# Patient Record
Sex: Male | Born: 1960 | Race: White | Hispanic: No | Smoking: Former smoker
Health system: Southern US, Community
[De-identification: ages and names within clinical notes are randomized; demographics above are authoritative.]

## PROBLEM LIST (undated history)

## (undated) DIAGNOSIS — T7840XA Allergy, unspecified, initial encounter: Secondary | ICD-10-CM

## (undated) DIAGNOSIS — E119 Type 2 diabetes mellitus without complications: Secondary | ICD-10-CM

## (undated) DIAGNOSIS — I10 Essential (primary) hypertension: Secondary | ICD-10-CM

## (undated) DIAGNOSIS — N200 Calculus of kidney: Secondary | ICD-10-CM

## (undated) DIAGNOSIS — M1990 Unspecified osteoarthritis, unspecified site: Secondary | ICD-10-CM

## (undated) DIAGNOSIS — E785 Hyperlipidemia, unspecified: Secondary | ICD-10-CM

## (undated) HISTORY — DX: Unspecified osteoarthritis, unspecified site: M19.90

## (undated) HISTORY — PX: KNEE SURGERY: SHX244

## (undated) HISTORY — PX: SHOULDER SURGERY: SHX246

## (undated) HISTORY — PX: TOTAL HIP ARTHROPLASTY: SHX124

## (undated) HISTORY — DX: Essential (primary) hypertension: I10

## (undated) HISTORY — DX: Hyperlipidemia, unspecified: E78.5

## (undated) HISTORY — DX: Calculus of kidney: N20.0

## (undated) HISTORY — DX: Allergy, unspecified, initial encounter: T78.40XA

---

## 2012-01-24 ENCOUNTER — Encounter (HOSPITAL_COMMUNITY): Payer: Self-pay | Admitting: *Deleted

## 2012-01-24 ENCOUNTER — Emergency Department (HOSPITAL_COMMUNITY)
Admission: EM | Admit: 2012-01-24 | Discharge: 2012-01-24 | Disposition: A | Discharge status: Home / Self Care | Payer: No Typology Code available for payment source | Attending: Emergency Medicine | Admitting: Emergency Medicine

## 2012-01-24 DIAGNOSIS — IMO0002 Reserved for concepts with insufficient information to code with codable children: Secondary | ICD-10-CM

## 2012-01-24 DIAGNOSIS — Z79899 Other long term (current) drug therapy: Secondary | ICD-10-CM | POA: Insufficient documentation

## 2012-01-24 DIAGNOSIS — Z7982 Long term (current) use of aspirin: Secondary | ICD-10-CM | POA: Insufficient documentation

## 2012-01-24 DIAGNOSIS — W3309XA Accidental discharge of other larger firearm, initial encounter: Secondary | ICD-10-CM | POA: Insufficient documentation

## 2012-01-24 DIAGNOSIS — Y929 Unspecified place or not applicable: Secondary | ICD-10-CM | POA: Insufficient documentation

## 2012-01-24 DIAGNOSIS — E119 Type 2 diabetes mellitus without complications: Secondary | ICD-10-CM | POA: Insufficient documentation

## 2012-01-24 DIAGNOSIS — Z23 Encounter for immunization: Secondary | ICD-10-CM | POA: Insufficient documentation

## 2012-01-24 DIAGNOSIS — S41109A Unspecified open wound of unspecified upper arm, initial encounter: Secondary | ICD-10-CM | POA: Insufficient documentation

## 2012-01-24 DIAGNOSIS — Y9389 Activity, other specified: Secondary | ICD-10-CM | POA: Insufficient documentation

## 2012-01-24 HISTORY — DX: Type 2 diabetes mellitus without complications: E11.9

## 2012-01-24 MED ORDER — TETANUS-DIPHTH-ACELL PERTUSSIS 5-2.5-18.5 LF-MCG/0.5 IM SUSP
INTRAMUSCULAR | Status: AC
  Filled 2012-01-24: qty 0.5

## 2012-01-24 MED ORDER — TETANUS-DIPHTH-ACELL PERTUSSIS 5-2.5-18.5 LF-MCG/0.5 IM SUSP
0.5000 mL | Freq: Once | INTRAMUSCULAR | Status: AC
  Administered 2012-01-24: 0.5 mL via INTRAMUSCULAR
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
## 2012-01-24 NOTE — ED Notes (Signed)
[employer] at bedside Officer Dorothey Baseman

## 2012-06-03 ENCOUNTER — Ambulatory Visit (INDEPENDENT_AMBULATORY_CARE_PROVIDER_SITE_OTHER): Payer: Federal, State, Local not specified - PPO | Admitting: Internal Medicine

## 2012-06-03 ENCOUNTER — Other Ambulatory Visit (INDEPENDENT_AMBULATORY_CARE_PROVIDER_SITE_OTHER): Payer: Federal, State, Local not specified - PPO

## 2012-06-03 ENCOUNTER — Encounter: Payer: Self-pay | Admitting: Internal Medicine

## 2012-06-03 VITALS — BP 136/72 | HR 88 | Temp 98.6°F | Resp 16 | Ht 73.0 in | Wt 293.0 lb

## 2012-06-03 DIAGNOSIS — E785 Hyperlipidemia, unspecified: Secondary | ICD-10-CM | POA: Insufficient documentation

## 2012-06-03 DIAGNOSIS — Z0000 Encounter for general adult medical examination without abnormal findings: Secondary | ICD-10-CM | POA: Insufficient documentation

## 2012-06-03 DIAGNOSIS — I10 Essential (primary) hypertension: Secondary | ICD-10-CM

## 2012-06-03 DIAGNOSIS — R9431 Abnormal electrocardiogram [ECG] [EKG]: Secondary | ICD-10-CM | POA: Insufficient documentation

## 2012-06-03 DIAGNOSIS — L84 Corns and callosities: Secondary | ICD-10-CM | POA: Insufficient documentation

## 2012-06-03 DIAGNOSIS — E1149 Type 2 diabetes mellitus with other diabetic neurological complication: Secondary | ICD-10-CM

## 2012-06-03 DIAGNOSIS — Z23 Encounter for immunization: Secondary | ICD-10-CM

## 2012-06-03 DIAGNOSIS — Z136 Encounter for screening for cardiovascular disorders: Secondary | ICD-10-CM

## 2012-06-03 DIAGNOSIS — E118 Type 2 diabetes mellitus with unspecified complications: Secondary | ICD-10-CM | POA: Insufficient documentation

## 2012-06-03 LAB — CBC WITH DIFFERENTIAL/PLATELET
Basophils Absolute: 0 10*3/uL (ref 0.0–0.1)
HCT: 40.6 % (ref 39.0–52.0)
Lymphs Abs: 1 10*3/uL (ref 0.7–4.0)
MCV: 89.5 fl (ref 78.0–100.0)
Monocytes Absolute: 0.5 10*3/uL (ref 0.1–1.0)
Monocytes Relative: 9.8 % (ref 3.0–12.0)
Neutrophils Relative %: 68.8 % (ref 43.0–77.0)
Platelets: 172 10*3/uL (ref 150.0–400.0)
RDW: 13 % (ref 11.5–14.6)

## 2012-06-03 LAB — MICROALBUMIN / CREATININE URINE RATIO
Creatinine,U: 64.3 mg/dL
Microalb, Ur: 0.4 mg/dL (ref 0.0–1.9)

## 2012-06-03 LAB — HM DIABETES FOOT EXAM: HM Diabetic Foot Exam: ABNORMAL

## 2012-06-03 LAB — URINALYSIS, ROUTINE W REFLEX MICROSCOPIC
Nitrite: NEGATIVE
Specific Gravity, Urine: 1.02 (ref 1.000–1.030)
Total Protein, Urine: NEGATIVE
pH: 5.5 (ref 5.0–8.0)

## 2012-06-03 LAB — COMPREHENSIVE METABOLIC PANEL
ALT: 31 U/L (ref 0–53)
Alkaline Phosphatase: 58 U/L (ref 39–117)
Sodium: 133 mEq/L — ABNORMAL LOW (ref 135–145)
Total Bilirubin: 0.5 mg/dL (ref 0.3–1.2)
Total Protein: 6.3 g/dL (ref 6.0–8.3)

## 2012-06-03 MED ORDER — SITAGLIP PHOS-METFORMIN HCL ER 50-1000 MG PO TB24: 1.0000 | ORAL_TABLET | Freq: Two times a day (BID) | ORAL | Status: DC

## 2012-06-03 NOTE — Assessment & Plan Note (Signed)
Q wave noted in V1 - he has no s/s but multiple risk factors for CAD I have ordered a Lexiscan to see if he has CAD and/or has had a small infarct

## 2012-06-03 NOTE — Assessment & Plan Note (Signed)
Exam done Vaccines were updated He was referred for a colonoscopy Labs ordered Pt ed material was given 

## 2012-06-03 NOTE — Assessment & Plan Note (Addendum)
FLP today and CPK level Will advise further if needed

## 2012-06-03 NOTE — Progress Notes (Signed)
Subjective:    Patient ID: Jim Schultz, male    DOB: [birth year], 52 y.o.   MRN: [id number]  Diabetes He presents for his follow-up diabetic visit. He has type 2 diabetes mellitus. The initial diagnosis of diabetes was made 5 years ago. His disease course has been worsening. There are no hypoglycemic associated symptoms. Pertinent negatives for hypoglycemia include no dizziness, headaches, pallor, seizures, speech difficulty or tremors. Associated symptoms include blurred vision, foot paresthesias, foot ulcerations, polydipsia, polyphagia and polyuria. Pertinent negatives for diabetes include no chest pain, no fatigue, no visual change, no weakness and no weight loss. There are no hypoglycemic complications. Symptoms are worsening. Diabetic complications include impotence and peripheral neuropathy. Pertinent negatives for diabetic complications include no autonomic neuropathy, CVA, heart disease, nephropathy, PVD or retinopathy. Current diabetic treatment includes oral agent (monotherapy). He is compliant with treatment most of the time. His weight is increasing steadily. He is following a generally unhealthy diet. When asked about meal planning, he reported none. He has not had a previous visit with a dietician. He never participates in exercise. His home blood glucose trend is increasing steadily. His breakfast blood glucose range is generally 180-200 mg/dl. His lunch blood glucose range is generally >200 mg/dl. His dinner blood glucose range is generally >200 mg/dl. His highest blood glucose is >200 mg/dl. His overall blood glucose range is >200 mg/dl. An ACE inhibitor/angiotensin II receptor blocker is being taken. He does not see a podiatrist.Eye exam is current.      Review of Systems  Constitutional: Negative.  Negative for fever, chills, weight loss, diaphoresis, activity change, appetite change, fatigue and unexpected weight change.  HENT: Negative.   Eyes: Positive for blurred vision.    Respiratory: Negative.  Negative for apnea, cough, choking, chest tightness, shortness of breath, wheezing and stridor.   Cardiovascular: Negative.  Negative for chest pain, palpitations and leg swelling.  Gastrointestinal: Negative.  Negative for nausea, vomiting, abdominal pain, diarrhea, constipation and anal bleeding.  Endocrine: Positive for polydipsia, polyphagia and polyuria. Negative for cold intolerance and heat intolerance.  Genitourinary: Positive for impotence. Negative for dysuria, urgency, frequency, hematuria, flank pain, decreased urine volume, difficulty urinating and testicular pain.  Musculoskeletal: Positive for arthralgias. Negative for myalgias, back pain, joint swelling and gait problem.  Skin: Positive for wound (ulcers and callouses on his toes). Negative for color change, pallor and rash.  Allergic/Immunologic: Negative.   Neurological: Negative.  Negative for dizziness, tremors, seizures, syncope, facial asymmetry, speech difficulty, weakness, light-headedness, numbness and headaches.  Hematological: Negative.  Negative for adenopathy. Does not bruise/bleed easily.  Psychiatric/Behavioral: Negative.        Objective:   Physical Exam  Vitals reviewed. Constitutional: He is oriented to person, place, and time. He appears well-developed and well-nourished. No distress.  HENT:  Head: Normocephalic and atraumatic.  Mouth/Throat: Oropharynx is clear and moist. No oropharyngeal exudate.  Eyes: Conjunctivae are normal. Right eye exhibits no discharge. Left eye exhibits no discharge. No scleral icterus.  Neck: Normal range of motion. Neck supple. No JVD present. No tracheal deviation present. No thyromegaly present.  Cardiovascular: Normal rate, regular rhythm, normal heart sounds and intact distal pulses.  Exam reveals no gallop and no friction rub.   No murmur heard. Pulses:      Carotid pulses are 1+ on the right side, and 1+ on the left side.      Radial pulses are  1+ on the right side, and 1+ on the left side.  Femoral pulses are 1+ on the right side, and 1+ on the left side.      Popliteal pulses are 1+ on the right side, and 1+ on the left side.       Dorsalis pedis pulses are 1+ on the right side, and 1+ on the left side.       Posterior tibial pulses are 1+ on the right side, and 1+ on the left side.  Pulmonary/Chest: Effort normal and breath sounds normal. No stridor. No respiratory distress. He has no wheezes. He has no rales. He exhibits no tenderness.  Abdominal: Soft. Bowel sounds are normal. He exhibits no distension and no mass. There is no tenderness. There is no rebound and no guarding. Hernia confirmed negative in the right inguinal area and confirmed negative in the left inguinal area.  Genitourinary: Rectum normal, testes normal and penis normal. Rectal exam shows no external hemorrhoid, no internal hemorrhoid, no fissure, no mass, no tenderness and anal tone normal. Guaiac negative stool. Prostate is enlarged (1+ smooth symm BPH). Prostate is not tender. Right testis shows no mass, no swelling and no tenderness. Right testis is descended. Left testis shows no mass, no swelling and no tenderness. Left testis is descended. Circumcised. No penile erythema or penile tenderness. No discharge found.  Musculoskeletal: Normal range of motion. He exhibits no edema and no tenderness.       Left foot: He exhibits deformity. He exhibits normal range of motion, no tenderness, no bony tenderness, no swelling, normal capillary refill, no crepitus and no laceration.       Feet:  Lymphadenopathy:    He has no cervical adenopathy.       Right: No inguinal adenopathy present.       Left: No inguinal adenopathy present.  Neurological: He is oriented to person, place, and time.  Skin: Skin is warm and dry. No rash noted. He is not diaphoretic. No erythema. No pallor.  Psychiatric: He has a normal mood and affect. His behavior is normal. Judgment and thought  content normal.     No results found for this basename: WBC, HGB, HCT, PLT, GLUCOSE, CHOL, TRIG, HDL, LDLDIRECT, LDLCALC, ALT, AST, NA, K, CL, CREATININE, BUN, CO2, TSH, PSA, INR, GLUF, HGBA1C, MICROALBUR       Assessment & Plan:

## 2012-06-03 NOTE — Assessment & Plan Note (Signed)
I have asked him to stop the SU - will treat with Janumet-XR I will check his A1C and renal function today, will also check his c-peptide level

## 2012-06-03 NOTE — Assessment & Plan Note (Signed)
His BP is well controlled Today I will check his lytes and renal function 

## 2012-06-03 NOTE — Assessment & Plan Note (Signed)
Podiatry referral

## 2012-06-03 NOTE — Patient Instructions (Signed)

## 2012-06-05 ENCOUNTER — Encounter: Payer: Self-pay | Admitting: Internal Medicine

## 2012-06-14 ENCOUNTER — Encounter: Payer: Self-pay | Admitting: Internal Medicine

## 2012-06-15 ENCOUNTER — Ambulatory Visit (HOSPITAL_COMMUNITY): Payer: Federal, State, Local not specified - PPO | Attending: Cardiovascular Disease | Admitting: Radiology

## 2012-06-15 VITALS — Ht 76.0 in | Wt 290.0 lb

## 2012-06-15 DIAGNOSIS — I10 Essential (primary) hypertension: Secondary | ICD-10-CM

## 2012-06-15 DIAGNOSIS — R079 Chest pain, unspecified: Secondary | ICD-10-CM

## 2012-06-15 DIAGNOSIS — R0989 Other specified symptoms and signs involving the circulatory and respiratory systems: Secondary | ICD-10-CM | POA: Insufficient documentation

## 2012-06-15 DIAGNOSIS — R0789 Other chest pain: Secondary | ICD-10-CM | POA: Insufficient documentation

## 2012-06-15 DIAGNOSIS — E785 Hyperlipidemia, unspecified: Secondary | ICD-10-CM

## 2012-06-15 DIAGNOSIS — R9431 Abnormal electrocardiogram [ECG] [EKG]: Secondary | ICD-10-CM | POA: Insufficient documentation

## 2012-06-15 DIAGNOSIS — R0602 Shortness of breath: Secondary | ICD-10-CM

## 2012-06-15 DIAGNOSIS — R0609 Other forms of dyspnea: Secondary | ICD-10-CM | POA: Insufficient documentation

## 2012-06-15 MED ORDER — TECHNETIUM TC 99M SESTAMIBI GENERIC - CARDIOLITE
30.0000 | Freq: Once | INTRAVENOUS | Status: AC | PRN
  Administered 2012-06-15: 30 via INTRAVENOUS

## 2012-06-15 MED ORDER — TECHNETIUM TC 99M SESTAMIBI GENERIC - CARDIOLITE
10.0000 | Freq: Once | INTRAVENOUS | Status: AC | PRN
  Administered 2012-06-15: 10 via INTRAVENOUS

## 2012-06-15 NOTE — Progress Notes (Signed)
[facility] SITE 3 NUCLEAR MED [address] [place], [place] [address] [phone]    Cardiology Nuclear Med Study  Jim Schultz is a 52 y.o. male     MRN : [id number]     DOB: 1960-04-02  Procedure Date: 06/15/2012  Nuclear Med Background Indication for Stress Test:  Evaluation for Ischemia and Abnormal EKG History:  No prior known history of CAD, 4 years ago [facility] :( in [place]) OK per patient,  Cardiac Risk Factors: Strong, premature Family History - CAD, History of Smoking, Hypertension, Lipids, NIDDM and Obesity  Symptoms:  Intermittent Chest burning, patient feeling anxious with chest tightness 2/10 @ present, DOE   Nuclear Pre-Procedure Caffeine/Decaff Intake:  None NPO After: 9:30pm   Lungs:  clear O2 Sat: 97% on room air. IV 0.9% NS with Angio Cath:  20g  IV Site: R Wrist  IV Started by:  Bonnita Levan, RN  Chest Size (in):  50 Cup Size: n/a  Height: 6\' 4"  (1.93 m)  Weight:  290 lb (131.543 kg)  BMI:  Body mass index is 35.31 kg/(m^2). Tech Comments:  N/A     Nuclear Med Study 1 or 2 day study: 1 day  Stress Test Type:  Stress  Reading MD: Kristeen Miss, MD  Order Authorizing Provider:  Sanda Linger, MD  Resting Radionuclide: Technetium 69m Sestamibi  Resting Radionuclide Dose: 11.0 mCi   Stress Radionuclide:  Technetium 4m Sestamibi  Stress Radionuclide Dose: 33.0 mCi           Stress Protocol Rest HR: 68 Stress HR: 155  Rest BP: 147/94 Stress BP: 219/63  Exercise Time (min): 8:00 METS: 9.2   Predicted Max HR: 168 bpm % Max HR: 92.26 bpm Rate Pressure Product: 21308   Dose of Adenosine (mg):  n/a Dose of Lexiscan: n/a mg  Dose of Atropine (mg): n/a Dose of Dobutamine: n/a mcg/kg/min (at max HR)  Stress Test Technologist: Irean Hong, RN  Nuclear Technologist:  Doyne Keel, CNMT     Rest Procedure:  Myocardial perfusion imaging was performed at rest 45 minutes following the intravenous administration of Technetium 44m Sestamibi. Rest ECG:  NSR - Normal EKG  Stress Procedure:  The patient exercised on the treadmill utilizing the Bruce Protocol for 8 minutes, RPE=15. The patient stopped due to DOE and denied any chest pain.  There was a marked hypertensive response to exercise.Technetium 7m Sestamibi was injected at peak exercise and myocardial perfusion imaging was performed after a brief delay. Stress ECG: No significant change from baseline ECG  QPS Raw Data Images:  Normal; no motion artifact; normal heart/lung ratio. Stress Images:  Normal homogeneous uptake in all areas of the myocardium. Rest Images:  Normal homogeneous uptake in all areas of the myocardium. Subtraction (SDS):  No evidence of ischemia. Transient Ischemic Dilatation (Normal <1.22):  1.02 Lung/Heart Ratio (Normal <0.45):  0.30  Quantitative Gated Spect Images QGS EDV:  132 ml QGS ESV:  58 ml  Impression Exercise Capacity:  Fair exercise capacity. BP Response:  Hypertensive blood pressure response. Clinical Symptoms:  Mild dyspnea ECG Impression:  No significant ST segment change suggestive of ischemia. Comparison with Prior Nuclear Study: No previous nuclear study performed  Overall Impression:  Normal stress nuclear study.  LV Ejection Fraction: 56%.  LV Wall Motion:  NL LV Function; NL Wall Motion.   Vesta Mixer, Montez Hageman., MD, [facility] 06/15/2012, 4:02 PM Office - [phone] Pager [phone]

## 2012-06-20 ENCOUNTER — Encounter (HOSPITAL_COMMUNITY): Payer: Federal, State, Local not specified - PPO

## 2012-06-22 ENCOUNTER — Encounter (HOSPITAL_COMMUNITY): Payer: Federal, State, Local not specified - PPO

## 2012-06-27 ENCOUNTER — Encounter: Payer: Self-pay | Admitting: Internal Medicine

## 2012-06-28 ENCOUNTER — Other Ambulatory Visit: Payer: Self-pay | Admitting: Internal Medicine

## 2012-06-28 DIAGNOSIS — E1149 Type 2 diabetes mellitus with other diabetic neurological complication: Secondary | ICD-10-CM

## 2012-07-27 ENCOUNTER — Ambulatory Visit (AMBULATORY_SURGERY_CENTER): Payer: Federal, State, Local not specified - PPO | Admitting: *Deleted

## 2012-07-27 ENCOUNTER — Other Ambulatory Visit: Payer: Self-pay | Admitting: Internal Medicine

## 2012-07-27 VITALS — Ht 76.0 in | Wt 291.0 lb

## 2012-07-27 DIAGNOSIS — Z1211 Encounter for screening for malignant neoplasm of colon: Secondary | ICD-10-CM

## 2012-07-27 MED ORDER — MOVIPREP 100 G PO SOLR: ORAL | Status: DC

## 2012-07-27 NOTE — Telephone Encounter (Signed)
Patient would like 90 day supplies of the following meds called in to [facility] mail order [address] [place] [address] XB#[id number]: Fenofibrate 160 mg, janumet, levitra, and lisinopril.

## 2012-07-28 ENCOUNTER — Encounter: Payer: Self-pay | Admitting: Internal Medicine

## 2012-07-28 MED ORDER — LISINOPRIL 20 MG PO TABS: 20.0000 mg | ORAL_TABLET | Freq: Every day | ORAL | Status: DC

## 2012-07-28 MED ORDER — FENOFIBRATE 160 MG PO TABS: 160.0000 mg | ORAL_TABLET | Freq: Every day | ORAL | Status: DC

## 2012-07-28 MED ORDER — VARDENAFIL HCL 20 MG PO TABS: 20.0000 mg | ORAL_TABLET | Freq: Every day | ORAL | Status: DC | PRN

## 2012-07-28 NOTE — Telephone Encounter (Signed)
Please advise on Janumet XR dosage, medication history does show a mg, only a mg shown for the non XR Janumet. Called patient who states that there is not a mg listed. Please advise so that I may send to mail order. Thanks

## 2012-08-15 ENCOUNTER — Encounter: Payer: Self-pay | Admitting: Internal Medicine

## 2012-08-15 ENCOUNTER — Ambulatory Visit (AMBULATORY_SURGERY_CENTER): Payer: Federal, State, Local not specified - PPO | Admitting: Internal Medicine

## 2012-08-15 ENCOUNTER — Telehealth: Payer: Self-pay | Admitting: Internal Medicine

## 2012-08-15 VITALS — BP 144/85 | HR 65 | Temp 97.8°F | Resp 18 | Ht 76.0 in | Wt 291.0 lb

## 2012-08-15 DIAGNOSIS — Z1211 Encounter for screening for malignant neoplasm of colon: Secondary | ICD-10-CM

## 2012-08-15 DIAGNOSIS — D126 Benign neoplasm of colon, unspecified: Secondary | ICD-10-CM

## 2012-08-15 DIAGNOSIS — E1149 Type 2 diabetes mellitus with other diabetic neurological complication: Secondary | ICD-10-CM

## 2012-08-15 LAB — GLUCOSE, CAPILLARY
Glucose-Capillary: 183 mg/dL — ABNORMAL HIGH (ref 70–99)
Glucose-Capillary: 176 mg/dL — ABNORMAL HIGH (ref 70–99)

## 2012-08-15 MED ORDER — SODIUM CHLORIDE 0.9 % IV SOLN: 500.0000 mL | INTRAVENOUS | Status: DC

## 2012-08-15 NOTE — Telephone Encounter (Signed)
Patient stated that [facility] kicked RX's for Janumet and Levitra backed to [place] - possible [place]?  Patient states has been out of meds for one week now.  He is requesting a call back today.  Patient also stated that we referred him to an ophthalmologist for a retinal screening.  Once he got there he was told that they no longer did retinal screening test from our office.  He needs to be referred to another ophthalmologist.

## 2012-08-15 NOTE — Telephone Encounter (Signed)
No rejection has been received from pharmacy, according to Epic, janumet has not been sent to pharmacy. I will resubmit both rx to pharmacy and see if rejected. Please advise on referral for retinal screening for this pt. Thanks

## 2012-08-15 NOTE — Patient Instructions (Addendum)
YOU HAD AN ENDOSCOPIC PROCEDURE TODAY AT THE [facility]: Refer to the procedure report that was given to you for any specific questions about what was found during the examination.  If the procedure report does not answer your questions, please call your gastroenterologist to clarify.  If you requested that your care partner not be given the details of your procedure findings, then the procedure report has been included in a sealed envelope for you to review at your convenience later.  YOU SHOULD EXPECT: Some feelings of bloating in the abdomen. Passage of more gas than usual.  Walking can help get rid of the air that was put into your GI tract during the procedure and reduce the bloating. If you had a lower endoscopy (such as a colonoscopy or flexible sigmoidoscopy) you may notice spotting of blood in your stool or on the toilet paper. If you underwent a bowel prep for your procedure, then you may not have a normal bowel movement for a few days.  DIET: Your first meal following the procedure should be a light meal and then it is ok to progress to your normal diet.  A half-sandwich or bowl of soup is an example of a good first meal.  Heavy or fried foods are harder to digest and may make you feel nauseous or bloated.  Likewise meals heavy in dairy and vegetables can cause extra gas to form and this can also increase the bloating.  Drink plenty of fluids but you should avoid alcoholic beverages for 24 hours.  ACTIVITY: Your care partner should take you home directly after the procedure.  You should plan to take it easy, moving slowly for the rest of the day.  You can resume normal activity the day after the procedure however you should NOT DRIVE or use heavy machinery for 24 hours (because of the sedation medicines used during the test).    SYMPTOMS TO REPORT IMMEDIATELY: A gastroenterologist can be reached at any hour.  During normal business hours, 8:30 AM to 5:00 PM Monday through Friday,  call [phone].  After hours and on weekends, please call the GI answering service at [phone] who will take a message and have the physician on call contact you.   Following lower endoscopy (colonoscopy or flexible sigmoidoscopy):  Excessive amounts of blood in the stool  Significant tenderness or worsening of abdominal pains  Swelling of the abdomen that is new, acute  Fever of 100F or higher    FOLLOW UP: If any biopsies were taken you will be contacted by phone or by letter within the next 1-3 weeks.  Call your gastroenterologist if you have not heard about the biopsies in 3 weeks.  Our staff will call the home number listed on your records the next business day following your procedure to check on you and address any questions or concerns that you may have at that time regarding the information given to you following your procedure. This is a courtesy call and so if there is no answer at the home number and we have not heard from you through the emergency physician on call, we will assume that you have returned to your regular daily activities without incident.  SIGNATURES/CONFIDENTIALITY: You and/or your care partner have signed paperwork which will be entered into your electronic medical record.  These signatures attest to the fact that that the information above on your After Visit Summary has been reviewed and is understood.  Full responsibility of the confidentiality   of this discharge information lies with you and/or your care-partner.    Information on polyps given to you today 

## 2012-08-15 NOTE — Op Note (Signed)
[facility] [address] [place], [address]   COLONOSCOPY PROCEDURE REPORT  PATIENT: Jim Schultz, Jim Schultz  MR#: [id number] BIRTHDATE: 12/24/1960 , 51  yrs. old GENDER: Male ENDOSCOPIST: Beverley Fiedler, MD REFERRED MW:[id number] Karsten Ro, M.D. PROCEDURE DATE:  08/15/2012 PROCEDURE:   Colonoscopy with snare polypectomy ASA CLASS:   Class III INDICATIONS:average risk screening and first colonoscopy. MEDICATIONS: MAC sedation, administered by CRNA and propofol (Diprivan) 400mg  IV  DESCRIPTION OF PROCEDURE:   After the risks benefits and alternatives of the procedure were thoroughly explained, informed consent was obtained.  A digital rectal exam revealed no rectal mass.   The LB [id number]  endoscope was introduced through the anus and advanced to the cecum, which was identified by both the appendix and ileocecal valve. No adverse events experienced. The quality of the prep was good, using MoviPrep  The instrument was then slowly withdrawn as the colon was fully examined.   COLON FINDINGS: Two sessile polyps measuring 5 mm in size were found at the cecum and in the rectosigmoid colon.  Polypectomy was performed using cold snare.  All resections were complete and all polyp tissue was completely retrieved.   The colon mucosa was otherwise normal.  Retroflexed views revealed no abnormalities. The time to cecum=3 minutes 18 seconds.  Withdrawal time=12 minutes 15 seconds.  The scope was withdrawn and the procedure completed. COMPLICATIONS: There were no complications.  ENDOSCOPIC IMPRESSION: 1.   Two sessile polyps measuring 5 mm in size were found at the cecum and in the rectosigmoid colon; Polypectomy was performed using cold snare 2.   The colon mucosa was otherwise normal  RECOMMENDATIONS: 1.  Await pathology results 2.  If the polyps removed today are proven to be adenomatous (pre-cancerous) polyps, you will need a repeat colonoscopy in 5 years.  Otherwise you  should continue to follow colorectal cancer screening guidelines for "routine risk" patients with colonoscopy in 10 years.  You will receive a letter within 1-2 weeks with the results of your biopsy as well as final recommendations.  Please call my office if you have not received a letter after 3 weeks.   eSigned:  Beverley Fiedler, MD 08/15/2012 9:00 AM   cc: The Patient and Etta Grandchild, MD

## 2012-08-15 NOTE — Progress Notes (Signed)
Patient did not experience any of the following events: a burn prior to discharge; a fall within the facility; wrong site/side/patient/procedure/implant event; or a hospital transfer or hospital admission upon discharge from the facility. (G8907) Patient did not have preoperative order for IV antibiotic SSI prophylaxis. (G8918)  

## 2012-08-16 ENCOUNTER — Telehealth: Payer: Self-pay | Admitting: *Deleted

## 2012-08-16 NOTE — Telephone Encounter (Signed)
  Follow up Call-  Call back number 08/15/2012  Post procedure Call Back phone  # [phone]  Permission to leave phone message Yes     Patient questions:  Do you have a fever, pain , or abdominal swelling? no Pain Score  0 *  Have you tolerated food without any problems? yes  Have you been able to return to your normal activities? yes  Do you have any questions about your discharge instructions: Diet   no Medications  no Follow up visit  no  Do you have questions or concerns about your Care? no  Actions: * If pain score is 4 or above: No action needed, pain <4.

## 2012-08-17 ENCOUNTER — Encounter: Payer: Self-pay | Admitting: Internal Medicine

## 2012-08-18 LAB — HM COLONOSCOPY

## 2012-08-19 ENCOUNTER — Other Ambulatory Visit: Payer: Self-pay | Admitting: Internal Medicine

## 2012-08-19 DIAGNOSIS — E1149 Type 2 diabetes mellitus with other diabetic neurological complication: Secondary | ICD-10-CM

## 2012-08-19 MED ORDER — SITAGLIP PHOS-METFORMIN HCL ER 50-1000 MG PO TB24: 1.0000 | ORAL_TABLET | Freq: Two times a day (BID) | ORAL | Status: DC

## 2012-08-22 MED ORDER — VARDENAFIL HCL 20 MG PO TABS: 20.0000 mg | ORAL_TABLET | Freq: Every day | ORAL | Status: DC | PRN

## 2012-08-22 NOTE — Telephone Encounter (Signed)
Called [facility] and advised that Janumet was shipped out 6/13 and should receive 08/24/12. Insurance will only cover 20% cost of ED drugs and will need to be filled locally. Pt notified and request RX sent to [facility].

## 2012-08-24 ENCOUNTER — Encounter: Payer: Self-pay | Admitting: Internal Medicine

## 2012-08-31 ENCOUNTER — Encounter (INDEPENDENT_AMBULATORY_CARE_PROVIDER_SITE_OTHER): Payer: Federal, State, Local not specified - PPO | Admitting: Ophthalmology

## 2012-08-31 DIAGNOSIS — I10 Essential (primary) hypertension: Secondary | ICD-10-CM

## 2012-08-31 DIAGNOSIS — E11319 Type 2 diabetes mellitus with unspecified diabetic retinopathy without macular edema: Secondary | ICD-10-CM

## 2012-08-31 DIAGNOSIS — H35039 Hypertensive retinopathy, unspecified eye: Secondary | ICD-10-CM

## 2012-08-31 DIAGNOSIS — E1139 Type 2 diabetes mellitus with other diabetic ophthalmic complication: Secondary | ICD-10-CM

## 2012-08-31 DIAGNOSIS — H2510 Age-related nuclear cataract, unspecified eye: Secondary | ICD-10-CM

## 2012-08-31 DIAGNOSIS — H43819 Vitreous degeneration, unspecified eye: Secondary | ICD-10-CM

## 2012-10-26 ENCOUNTER — Ambulatory Visit (INDEPENDENT_AMBULATORY_CARE_PROVIDER_SITE_OTHER): Payer: Federal, State, Local not specified - PPO | Admitting: Internal Medicine

## 2012-10-26 ENCOUNTER — Ambulatory Visit (INDEPENDENT_AMBULATORY_CARE_PROVIDER_SITE_OTHER): Payer: Federal, State, Local not specified - PPO

## 2012-10-26 ENCOUNTER — Encounter: Payer: Self-pay | Admitting: Internal Medicine

## 2012-10-26 VITALS — BP 144/82 | HR 89 | Temp 98.4°F | Ht 76.0 in | Wt 289.0 lb

## 2012-10-26 DIAGNOSIS — I10 Essential (primary) hypertension: Secondary | ICD-10-CM

## 2012-10-26 DIAGNOSIS — L84 Corns and callosities: Secondary | ICD-10-CM

## 2012-10-26 DIAGNOSIS — E785 Hyperlipidemia, unspecified: Secondary | ICD-10-CM

## 2012-10-26 DIAGNOSIS — I889 Nonspecific lymphadenitis, unspecified: Secondary | ICD-10-CM

## 2012-10-26 DIAGNOSIS — E1149 Type 2 diabetes mellitus with other diabetic neurological complication: Secondary | ICD-10-CM

## 2012-10-26 LAB — CBC WITH DIFFERENTIAL/PLATELET
Basophils Relative: 0.3 % (ref 0.0–3.0)
Eosinophils Absolute: 0.1 10*3/uL (ref 0.0–0.7)
Eosinophils Relative: 2.5 % (ref 0.0–5.0)
Lymphocytes Relative: 18 % (ref 12.0–46.0)
Neutrophils Relative %: 67.2 % (ref 43.0–77.0)
RBC: 4.27 Mil/uL (ref 4.22–5.81)
WBC: 5.2 10*3/uL (ref 4.5–10.5)

## 2012-10-26 LAB — BASIC METABOLIC PANEL
Calcium: 9.6 mg/dL (ref 8.4–10.5)
GFR: 81.28 mL/min (ref >60.00)
Sodium: 138 mEq/L (ref 135–145)

## 2012-10-26 LAB — LIPID PANEL
HDL: 33.3 mg/dL — ABNORMAL LOW (ref >39.00)
VLDL: 55.8 mg/dL — ABNORMAL HIGH (ref 0.0–40.0)

## 2012-10-26 LAB — HEMOGLOBIN A1C: Hgb A1c MFr Bld: 7.5 % — ABNORMAL HIGH (ref 4.6–6.5)

## 2012-10-26 MED ORDER — AMOXICILLIN-POT CLAVULANATE 875-125 MG PO TABS: 1.0000 | ORAL_TABLET | Freq: Two times a day (BID) | ORAL | Status: DC

## 2012-10-26 MED ORDER — SIMVASTATIN 80 MG PO TABS: 80.0000 mg | ORAL_TABLET | Freq: Every day | ORAL | Status: DC

## 2012-10-26 MED ORDER — SITAGLIP PHOS-METFORMIN HCL ER 50-1000 MG PO TB24: 1.0000 | ORAL_TABLET | Freq: Two times a day (BID) | ORAL | Status: DC

## 2012-10-26 MED ORDER — LISINOPRIL 20 MG PO TABS: 20.0000 mg | ORAL_TABLET | Freq: Every day | ORAL | Status: DC

## 2012-10-26 MED ORDER — FENOFIBRATE 160 MG PO TABS: 160.0000 mg | ORAL_TABLET | Freq: Every day | ORAL | Status: DC

## 2012-10-26 NOTE — Progress Notes (Signed)
Subjective:    Patient ID: Jim Schultz, male    DOB: [birth year], 52 y.o.   MRN: [id number]  HPI  Today he complains of a 3 day history of enlarged painful lymph node under his left chin. He does not have a toothache or oral pain.  Review of Systems  Constitutional: Negative.  Negative for fever, chills, diaphoresis, appetite change and fatigue.  HENT: Negative.  Negative for hearing loss, ear pain, nosebleeds, congestion, sore throat, facial swelling, rhinorrhea, sneezing, drooling, mouth sores, trouble swallowing, neck pain, neck stiffness, dental problem, voice change, postnasal drip, sinus pressure, tinnitus and ear discharge.   Eyes: Negative.  Negative for pain, redness and visual disturbance.  Respiratory: Negative.  Negative for apnea, cough, choking, chest tightness, shortness of breath, wheezing and stridor.   Cardiovascular: Negative.  Negative for chest pain, palpitations and leg swelling.  Gastrointestinal: Negative.   Endocrine: Negative.  Negative for polydipsia, polyphagia and polyuria.  Genitourinary: Negative for difficulty urinating.  Musculoskeletal: Negative.   Skin: Negative.  Negative for color change, pallor and rash.  Allergic/Immunologic: Negative.   Neurological: Negative.  Negative for dizziness.  Hematological: Positive for adenopathy. Does not bruise/bleed easily.  Psychiatric/Behavioral: Negative.        Objective:   Physical Exam  Vitals reviewed. Constitutional: He is oriented to person, place, and time. He appears well-developed and well-nourished. No distress.  HENT:  Head: Normocephalic and atraumatic.  Right Ear: Hearing, tympanic membrane, external ear and ear canal normal.  Left Ear: Hearing, tympanic membrane, external ear and ear canal normal.  Nose: Nose normal. No mucosal edema, rhinorrhea or sinus tenderness. Right sinus exhibits no maxillary sinus tenderness and no frontal sinus tenderness. Left sinus exhibits no maxillary sinus  tenderness and no frontal sinus tenderness.  Mouth/Throat: Oropharynx is clear and moist and mucous membranes are normal. Mucous membranes are not pale, not dry and not cyanotic. No oral lesions. No trismus in the jaw. Normal dentition. No dental abscesses, edematous or dental caries. No oropharyngeal exudate, posterior oropharyngeal edema, posterior oropharyngeal erythema or tonsillar abscesses.  Eyes: Conjunctivae are normal. Right eye exhibits no discharge. Left eye exhibits no discharge. No scleral icterus.  Neck: Normal range of motion. Neck supple. No JVD present. No tracheal deviation present. No thyromegaly present.  Cardiovascular: Normal rate, regular rhythm, normal heart sounds and intact distal pulses.  Exam reveals no gallop and no friction rub.   No murmur heard. Pulmonary/Chest: Effort normal and breath sounds normal. No stridor. No respiratory distress. He has no wheezes. He has no rales. He exhibits no tenderness.  Abdominal: Soft. Bowel sounds are normal. He exhibits no distension and no mass. There is no tenderness. There is no rebound and no guarding.  Musculoskeletal: Normal range of motion. He exhibits no edema and no tenderness.  Lymphadenopathy:       Head (right side): No submental, no submandibular, no tonsillar, no preauricular, no posterior auricular and no occipital adenopathy present.       Head (left side): Submandibular adenopathy present. No submental, no tonsillar, no preauricular, no posterior auricular and no occipital adenopathy present.    He has no cervical adenopathy.       Right cervical: No superficial cervical, no deep cervical and no posterior cervical adenopathy present.      Left cervical: No superficial cervical, no deep cervical and no posterior cervical adenopathy present.    He has no axillary adenopathy.       Right: No inguinal, no supraclavicular and no  epitrochlear adenopathy present.       Left: No inguinal, no supraclavicular and no  epitrochlear adenopathy present.  Under the left side of the chin there is a 2x2 cm palpable lymph node that is firm and mildly tender but it is not fluctuant  or indurated, the node is freely mobile and not fixed. The overlying skin is normal.  Neurological: He is oriented to person, place, and time.  Skin: Skin is warm and dry. No rash noted. He is not diaphoretic. No erythema. No pallor.  Psychiatric: He has a normal mood and affect. His behavior is normal. Judgment and thought content normal.      Lab Results  Component Value Date   WBC 5.2 06/03/2012   HGB 14.3 06/03/2012   HCT 40.6 06/03/2012   PLT 172.0 06/03/2012   GLUCOSE 361* 06/03/2012   ALT 31 06/03/2012   AST 20 06/03/2012   NA 133* 06/03/2012   K 4.2 06/03/2012   CL 100 06/03/2012   CREATININE 1.0 06/03/2012   BUN 21 06/03/2012   CO2 27 06/03/2012   TSH 2.24 06/03/2012   PSA 0.63 06/03/2012   HGBA1C 9.4* 06/03/2012   MICROALBUR 0.4 06/03/2012      Assessment & Plan:

## 2012-10-26 NOTE — Patient Instructions (Signed)
Lymphadenopathy °Lymphadenopathy means "disease of the lymph glands." But the term is usually used to describe swollen or enlarged lymph glands, also called lymph nodes. These are the bean-shaped organs found in many locations including the neck, underarm, and groin. Lymph glands are part of the immune system, which fights infections in your body. Lymphadenopathy can occur in just one area of the body, such as the neck, or it can be generalized, with lymph node enlargement in several areas. The nodes found in the neck are the most common sites of lymphadenopathy. °CAUSES  °When your immune system responds to germs (such as viruses or bacteria ), infection-fighting cells and fluid build up. This causes the glands to grow in size. This is usually not something to worry about. Sometimes, the glands themselves can become infected and inflamed. This is called lymphadenitis. °Enlarged lymph nodes can be caused by many diseases: °· Bacterial disease, such as strep throat or a skin infection. °· Viral disease, such as a common cold. °· Other germs, such as lyme disease, tuberculosis, or sexually transmitted diseases. °· Cancers, such as lymphoma (cancer of the lymphatic system) or leukemia (cancer of the white blood cells). °· Inflammatory diseases such as lupus or rheumatoid arthritis. °· Reactions to medications. °Many of the diseases above are rare, but important. This is why you should see your caregiver if you have lymphadenopathy. °SYMPTOMS  °· Swollen, enlarged lumps in the neck, back of the head or other locations. °· Tenderness. °· Warmth or redness of the skin over the lymph nodes. °· Fever. °DIAGNOSIS  °Enlarged lymph nodes are often near the source of infection. They can help healthcare providers diagnose your illness. For instance:  °· Swollen lymph nodes around the jaw might be caused by an infection in the mouth. °· Enlarged glands in the neck often signal a throat infection. °· Lymph nodes that are swollen  in more than one area often indicate an illness caused by a virus. °Your caregiver most likely will know what is causing your lymphadenopathy after listening to your history and examining you. Blood tests, x-rays or other tests may be needed. If the cause of the enlarged lymph node cannot be found, and it does not go away by itself, then a biopsy may be needed. Your caregiver will discuss this with you. °TREATMENT  °Treatment for your enlarged lymph nodes will depend on the cause. Many times the nodes will shrink to normal size by themselves, with no treatment. Antibiotics or other medicines may be needed for infection. Only take over-the-counter or prescription medicines for pain, discomfort or fever as directed by your caregiver. °HOME CARE INSTRUCTIONS  °Swollen lymph glands usually return to normal when the underlying medical condition goes away. If they persist, contact your health-care provider. He/she might prescribe antibiotics or other treatments, depending on the diagnosis. Take any medications exactly as prescribed. Keep any follow-up appointments made to check on the condition of your enlarged nodes.  °SEEK MEDICAL CARE IF:  °· Swelling lasts for more than two weeks. °· You have symptoms such as weight loss, night sweats, fatigue or fever that does not go away. °· The lymph nodes are hard, seem fixed to the skin or are growing rapidly. °· Skin over the lymph nodes is red and inflamed. This could mean there is an infection. °SEEK IMMEDIATE MEDICAL CARE IF:  °· Fluid starts leaking from the area of the enlarged lymph node. °· You develop a fever of 102° F (38.9° C) or greater. °· Severe   pain develops (not necessarily at the site of a large lymph node). °· You develop chest pain or shortness of breath. °· You develop worsening abdominal pain. °MAKE SURE YOU:  °· Understand these instructions. °· Will watch your condition. °· Will get help right away if you are not doing well or get worse. °Document  Released: 12/03/2007 Document Revised: 05/18/2011 Document Reviewed: 12/03/2007 °[employer]® Patient Information ©2014 [employer]. ° °

## 2012-10-27 ENCOUNTER — Encounter: Payer: Self-pay | Admitting: Internal Medicine

## 2012-10-27 LAB — LDL CHOLESTEROL, DIRECT: Direct LDL: 70.4 mg/dL

## 2012-10-27 NOTE — Assessment & Plan Note (Signed)
His A1C is much improved He will continue the same meds for now

## 2012-10-27 NOTE — Assessment & Plan Note (Signed)
Goal achieved He is doing well on simvastatin

## 2012-10-27 NOTE — Assessment & Plan Note (Signed)
This appears to be an acute bacterial infection, I have asked him to start augmentin

## 2012-10-27 NOTE — Assessment & Plan Note (Signed)
His BP is adequately well controlled His lytes and renal function look good

## 2013-01-12 ENCOUNTER — Other Ambulatory Visit: Payer: Self-pay

## 2013-02-08 ENCOUNTER — Other Ambulatory Visit: Payer: Self-pay | Admitting: *Deleted

## 2013-02-08 DIAGNOSIS — I10 Essential (primary) hypertension: Secondary | ICD-10-CM

## 2013-02-08 DIAGNOSIS — E785 Hyperlipidemia, unspecified: Secondary | ICD-10-CM

## 2013-02-08 DIAGNOSIS — E1149 Type 2 diabetes mellitus with other diabetic neurological complication: Secondary | ICD-10-CM

## 2013-02-08 MED ORDER — SIMVASTATIN 80 MG PO TABS: 80.0000 mg | ORAL_TABLET | Freq: Every day | ORAL | Status: DC

## 2013-02-08 MED ORDER — LISINOPRIL 20 MG PO TABS: 20.0000 mg | ORAL_TABLET | Freq: Every day | ORAL | Status: DC

## 2013-02-08 MED ORDER — SITAGLIP PHOS-METFORMIN HCL ER 50-1000 MG PO TB24: 1.0000 | ORAL_TABLET | Freq: Two times a day (BID) | ORAL | Status: DC

## 2013-02-08 MED ORDER — FENOFIBRATE 160 MG PO TABS: 160.0000 mg | ORAL_TABLET | Freq: Every day | ORAL | Status: DC

## 2013-06-10 ENCOUNTER — Other Ambulatory Visit: Payer: Self-pay | Admitting: Internal Medicine

## 2013-06-15 ENCOUNTER — Other Ambulatory Visit: Payer: Self-pay

## 2013-07-24 ENCOUNTER — Other Ambulatory Visit (INDEPENDENT_AMBULATORY_CARE_PROVIDER_SITE_OTHER): Payer: Federal, State, Local not specified - PPO

## 2013-07-24 ENCOUNTER — Ambulatory Visit (INDEPENDENT_AMBULATORY_CARE_PROVIDER_SITE_OTHER): Payer: Federal, State, Local not specified - PPO | Admitting: Internal Medicine

## 2013-07-24 ENCOUNTER — Encounter: Payer: Self-pay | Admitting: Internal Medicine

## 2013-07-24 VITALS — BP 180/110 | HR 88 | Temp 98.2°F | Resp 16 | Ht 76.0 in | Wt 287.0 lb

## 2013-07-24 DIAGNOSIS — E1149 Type 2 diabetes mellitus with other diabetic neurological complication: Secondary | ICD-10-CM

## 2013-07-24 DIAGNOSIS — I10 Essential (primary) hypertension: Secondary | ICD-10-CM

## 2013-07-24 DIAGNOSIS — E785 Hyperlipidemia, unspecified: Secondary | ICD-10-CM

## 2013-07-24 LAB — CBC WITH DIFFERENTIAL/PLATELET
Basophils Absolute: 0 10*3/uL (ref 0.0–0.1)
Basophils Relative: 0.1 % (ref 0.0–3.0)
EOS ABS: 0.2 10*3/uL (ref 0.0–0.7)
Eosinophils Relative: 2.9 % (ref 0.0–5.0)
HEMATOCRIT: 43.3 % (ref 39.0–52.0)
Hemoglobin: 15 g/dL (ref 13.0–17.0)
LYMPHS ABS: 1.5 10*3/uL (ref 0.7–4.0)
Lymphocytes Relative: 19.9 % (ref 12.0–46.0)
MCHC: 34.5 g/dL (ref 30.0–36.0)
MCV: 91.4 fl (ref 78.0–100.0)
MONO ABS: 0.7 10*3/uL (ref 0.1–1.0)
Monocytes Relative: 9 % (ref 3.0–12.0)
Neutro Abs: 5.2 10*3/uL (ref 1.4–7.7)
Neutrophils Relative %: 68.1 % (ref 43.0–77.0)
Platelets: 204 10*3/uL (ref 150.0–400.0)
RBC: 4.74 Mil/uL (ref 4.22–5.81)
RDW: 13.4 % (ref 11.5–15.5)
WBC: 7.7 10*3/uL (ref 4.0–10.5)

## 2013-07-24 LAB — COMPREHENSIVE METABOLIC PANEL
ALBUMIN: 4.1 g/dL (ref 3.5–5.2)
ALT: 37 U/L (ref 0–53)
AST: 24 U/L (ref 0–37)
Alkaline Phosphatase: 61 U/L (ref 39–117)
BILIRUBIN TOTAL: 1.1 mg/dL (ref 0.2–1.2)
BUN: 14 mg/dL (ref 6–23)
CO2: 26 meq/L (ref 19–32)
Calcium: 9.2 mg/dL (ref 8.4–10.5)
Chloride: 103 mEq/L (ref 96–112)
Creatinine, Ser: 0.8 mg/dL (ref 0.4–1.5)
GFR: 104.26 mL/min (ref >60.00)
Glucose, Bld: 238 mg/dL — ABNORMAL HIGH (ref 70–99)
POTASSIUM: 3.9 meq/L (ref 3.5–5.1)
SODIUM: 137 meq/L (ref 135–145)
TOTAL PROTEIN: 6.7 g/dL (ref 6.0–8.3)

## 2013-07-24 LAB — LIPID PANEL
CHOLESTEROL: 142 mg/dL (ref 0–200)
HDL: 36.9 mg/dL — ABNORMAL LOW (ref >39.00)
LDL Cholesterol: 44 mg/dL (ref 0–99)
Total CHOL/HDL Ratio: 4
Triglycerides: 306 mg/dL — ABNORMAL HIGH (ref 0.0–149.0)
VLDL: 61.2 mg/dL — ABNORMAL HIGH (ref 0.0–40.0)

## 2013-07-24 LAB — HEMOGLOBIN A1C: Hgb A1c MFr Bld: 9.2 % — ABNORMAL HIGH (ref 4.6–6.5)

## 2013-07-24 LAB — TSH: TSH: 2.01 u[IU]/mL (ref 0.35–4.50)

## 2013-07-24 MED ORDER — SITAGLIP PHOS-METFORMIN HCL ER 50-1000 MG PO TB24: 1.0000 | ORAL_TABLET | Freq: Two times a day (BID) | ORAL | Status: DC

## 2013-07-24 MED ORDER — FENOFIBRATE 160 MG PO TABS: 160.0000 mg | ORAL_TABLET | Freq: Every day | ORAL | Status: DC

## 2013-07-24 MED ORDER — OLMESARTAN MEDOXOMIL-HCTZ 40-25 MG PO TABS: 1.0000 | ORAL_TABLET | Freq: Every day | ORAL | Status: DC

## 2013-07-24 MED ORDER — DAPAGLIFLOZIN PROPANEDIOL 10 MG PO TABS: 1.0000 | ORAL_TABLET | Freq: Every day | ORAL | Status: DC

## 2013-07-24 NOTE — Addendum Note (Signed)
Addended by: Janith Lima on: 07/24/2013 11:50 AM   Modules accepted: Orders

## 2013-07-24 NOTE — Patient Instructions (Signed)

## 2013-07-24 NOTE — Assessment & Plan Note (Signed)
He is doing well on zocor I will recheck his FLP today

## 2013-07-24 NOTE — Progress Notes (Signed)
Subjective:    Patient ID: Jim Schultz, male    DOB: February 05, 1961, 53 y.o.   MRN: [id number]  Hypertension This is a chronic problem. The current episode started more than 1 year ago. The problem has been gradually worsening since onset. Associated symptoms include peripheral edema. Pertinent negatives include no anxiety, blurred vision, chest pain, headaches, malaise/fatigue, neck pain, orthopnea, palpitations, PND, shortness of breath or sweats. Past treatments include ACE inhibitors. The current treatment provides no improvement. Compliance problems include diet and exercise.       Review of Systems  Constitutional: Negative.  Negative for fever, chills, malaise/fatigue, diaphoresis, appetite change and fatigue.  HENT: Negative.   Eyes: Negative.  Negative for blurred vision.  Respiratory: Negative.  Negative for cough, choking, chest tightness and shortness of breath.   Cardiovascular: Positive for leg swelling. Negative for chest pain, palpitations, orthopnea and PND.  Gastrointestinal: Negative.  Negative for nausea, vomiting, abdominal pain, diarrhea, constipation and blood in stool.  Endocrine: Negative.  Negative for polydipsia, polyphagia and polyuria.  Genitourinary: Negative.  Negative for dysuria, urgency, frequency, hematuria, decreased urine volume and difficulty urinating.  Musculoskeletal: Negative.  Negative for arthralgias, back pain, gait problem, joint swelling, myalgias, neck pain and neck stiffness.  Skin: Negative.   Allergic/Immunologic: Negative.   Neurological: Negative.  Negative for headaches.  Hematological: Negative.  Negative for adenopathy. Does not bruise/bleed easily.  Psychiatric/Behavioral: Negative.        Objective:   Physical Exam  Vitals reviewed. Constitutional: He is oriented to person, place, and time. He appears well-developed and well-nourished. No distress.  HENT:  Head: Normocephalic and atraumatic.  Mouth/Throat: Oropharynx is clear  and moist. No oropharyngeal exudate.  Eyes: Conjunctivae are normal. Right eye exhibits no discharge. Left eye exhibits no discharge. No scleral icterus.  Neck: Normal range of motion. Neck supple. No JVD present. No tracheal deviation present. No thyromegaly present.  Cardiovascular: Normal rate, regular rhythm, normal heart sounds and intact distal pulses.  Exam reveals no gallop and no friction rub.   No murmur heard. Pulmonary/Chest: Effort normal and breath sounds normal. No stridor. No respiratory distress. He has no wheezes. He has no rales. He exhibits no tenderness.  Abdominal: Soft. Bowel sounds are normal. He exhibits no distension and no mass. There is no tenderness. There is no rebound and no guarding.  Musculoskeletal: Normal range of motion. He exhibits edema (2+ pitting edema in BLE). He exhibits no tenderness.  Lymphadenopathy:    He has no cervical adenopathy.  Neurological: He is oriented to person, place, and time.  Skin: Skin is warm and dry. No rash noted. He is not diaphoretic. No erythema. No pallor.  Psychiatric: He has a normal mood and affect. His behavior is normal. Judgment and thought content normal.     Lab Results  Component Value Date   WBC 5.2 10/26/2012   HGB 13.6 10/26/2012   HCT 38.9* 10/26/2012   PLT 196.0 10/26/2012   GLUCOSE 200* 10/26/2012   CHOL 141 10/26/2012   TRIG 279.0* 10/26/2012   HDL 33.30* 10/26/2012   LDLDIRECT 70.4 10/26/2012   ALT 31 06/03/2012   AST 20 06/03/2012   NA 138 10/26/2012   K 4.3 10/26/2012   CL 102 10/26/2012   CREATININE 1.0 10/26/2012   BUN 19 10/26/2012   CO2 28 10/26/2012   TSH 1.32 10/26/2012   PSA 0.63 06/03/2012   HGBA1C 7.5* 10/26/2012   MICROALBUR 0.4 06/03/2012        Assessment &  Plan:

## 2013-07-24 NOTE — Progress Notes (Signed)
Pre visit review using our clinic review tool, if applicable. No additional management support is needed unless otherwise documented below in the visit note. 

## 2013-07-24 NOTE — Assessment & Plan Note (Addendum)
I will check his A1C and will monitor his renal function  Late note - his A1C is up quite a bit, I have asked him to add an SGLT-2 I

## 2013-07-24 NOTE — Assessment & Plan Note (Signed)
His BP is not well controlled and he has edema I have asked him to change the ACEI to ARB + HCTZ to control the BP I will check his labs to look for end organ and secondary causes of HTN

## 2013-08-23 ENCOUNTER — Ambulatory Visit: Payer: Federal, State, Local not specified - PPO | Admitting: Internal Medicine

## 2013-09-12 ENCOUNTER — Telehealth: Payer: Self-pay

## 2013-09-12 NOTE — Telephone Encounter (Signed)
Diabetic bundle-blood pressure Called and LM on pt VM to schedule f/u appointment concerning BP, I also sent pt a message through mychart.

## 2013-09-18 ENCOUNTER — Ambulatory Visit (INDEPENDENT_AMBULATORY_CARE_PROVIDER_SITE_OTHER): Payer: Federal, State, Local not specified - PPO | Admitting: Ophthalmology

## 2013-09-20 ENCOUNTER — Telehealth: Payer: Self-pay | Admitting: *Deleted

## 2013-09-20 DIAGNOSIS — E1149 Type 2 diabetes mellitus with other diabetic neurological complication: Secondary | ICD-10-CM

## 2013-09-20 DIAGNOSIS — I10 Essential (primary) hypertension: Secondary | ICD-10-CM

## 2013-09-20 MED ORDER — OLMESARTAN MEDOXOMIL-HCTZ 40-25 MG PO TABS: 1.0000 | ORAL_TABLET | Freq: Every day | ORAL | Status: DC

## 2013-09-20 NOTE — Telephone Encounter (Signed)
Pt stated md gave him some samples of benicar at last visit. Had to change follow-up until 10/06/13. Pt is needing benicar. Inform ot that [facility], but [facility].../lmb

## 2013-10-06 ENCOUNTER — Encounter: Payer: Self-pay | Admitting: Internal Medicine

## 2013-10-06 ENCOUNTER — Other Ambulatory Visit (INDEPENDENT_AMBULATORY_CARE_PROVIDER_SITE_OTHER): Payer: Federal, State, Local not specified - PPO

## 2013-10-06 ENCOUNTER — Ambulatory Visit (INDEPENDENT_AMBULATORY_CARE_PROVIDER_SITE_OTHER): Payer: Federal, State, Local not specified - PPO | Admitting: Internal Medicine

## 2013-10-06 VITALS — BP 162/96 | HR 100 | Temp 98.6°F | Resp 16 | Ht 76.0 in | Wt 285.1 lb

## 2013-10-06 DIAGNOSIS — R609 Edema, unspecified: Secondary | ICD-10-CM | POA: Insufficient documentation

## 2013-10-06 DIAGNOSIS — E1149 Type 2 diabetes mellitus with other diabetic neurological complication: Secondary | ICD-10-CM

## 2013-10-06 DIAGNOSIS — Z9989 Dependence on other enabling machines and devices: Secondary | ICD-10-CM

## 2013-10-06 DIAGNOSIS — I10 Essential (primary) hypertension: Secondary | ICD-10-CM

## 2013-10-06 DIAGNOSIS — G4733 Obstructive sleep apnea (adult) (pediatric): Secondary | ICD-10-CM | POA: Insufficient documentation

## 2013-10-06 LAB — COMPREHENSIVE METABOLIC PANEL
ALT: 30 U/L (ref 0–53)
AST: 20 U/L (ref 0–37)
Albumin: 4 g/dL (ref 3.5–5.2)
Alkaline Phosphatase: 47 U/L (ref 39–117)
BUN: 17 mg/dL (ref 6–23)
CALCIUM: 9.4 mg/dL (ref 8.4–10.5)
CHLORIDE: 102 meq/L (ref 96–112)
CO2: 27 meq/L (ref 19–32)
Creatinine, Ser: 1 mg/dL (ref 0.4–1.5)
GFR: 85.82 mL/min (ref >60.00)
GLUCOSE: 197 mg/dL — AB (ref 70–99)
POTASSIUM: 4 meq/L (ref 3.5–5.1)
Sodium: 137 mEq/L (ref 135–145)
TOTAL PROTEIN: 6.5 g/dL (ref 6.0–8.3)
Total Bilirubin: 0.7 mg/dL (ref 0.2–1.2)

## 2013-10-06 LAB — URINALYSIS, ROUTINE W REFLEX MICROSCOPIC
Bilirubin Urine: NEGATIVE
Hgb urine dipstick: NEGATIVE
KETONES UR: NEGATIVE
Leukocytes, UA: NEGATIVE
Nitrite: NEGATIVE
RBC / HPF: NONE SEEN (ref 0–?)
Specific Gravity, Urine: 1.015 (ref 1.000–1.030)
TOTAL PROTEIN, URINE-UPE24: NEGATIVE
Urine Glucose: 250 — AB
Urobilinogen, UA: 0.2 (ref 0.0–1.0)
WBC UA: NONE SEEN (ref 0–?)
pH: 6 (ref 5.0–8.0)

## 2013-10-06 LAB — CBC WITH DIFFERENTIAL/PLATELET
Basophils Absolute: 0 10*3/uL (ref 0.0–0.1)
Basophils Relative: 0.2 % (ref 0.0–3.0)
EOS PCT: 2.3 % (ref 0.0–5.0)
Eosinophils Absolute: 0.1 10*3/uL (ref 0.0–0.7)
HCT: 40.6 % (ref 39.0–52.0)
Hemoglobin: 13.8 g/dL (ref 13.0–17.0)
Lymphocytes Relative: 22.5 % (ref 12.0–46.0)
Lymphs Abs: 1.3 10*3/uL (ref 0.7–4.0)
MCHC: 34.1 g/dL (ref 30.0–36.0)
MCV: 91.5 fl (ref 78.0–100.0)
MONOS PCT: 10.2 % (ref 3.0–12.0)
Monocytes Absolute: 0.6 10*3/uL (ref 0.1–1.0)
NEUTROS PCT: 64.8 % (ref 43.0–77.0)
Neutro Abs: 3.6 10*3/uL (ref 1.4–7.7)
PLATELETS: 200 10*3/uL (ref 150.0–400.0)
RBC: 4.44 Mil/uL (ref 4.22–5.81)
RDW: 13.6 % (ref 11.5–15.5)
WBC: 5.6 10*3/uL (ref 4.0–10.5)

## 2013-10-06 LAB — HEMOGLOBIN A1C: Hgb A1c MFr Bld: 8.8 % — ABNORMAL HIGH (ref 4.6–6.5)

## 2013-10-06 LAB — TSH: TSH: 1.65 u[IU]/mL (ref 0.35–4.50)

## 2013-10-06 LAB — BRAIN NATRIURETIC PEPTIDE: PRO B NATRI PEPTIDE: 13 pg/mL (ref 0.0–100.0)

## 2013-10-06 MED ORDER — ONETOUCH VERIO VI SOLN: 1.0000 | Freq: Three times a day (TID) | Status: DC

## 2013-10-06 MED ORDER — SIMVASTATIN 80 MG PO TABS: 80.0000 mg | ORAL_TABLET | Freq: Every day | ORAL | Status: DC

## 2013-10-06 MED ORDER — FENOFIBRATE 160 MG PO TABS: 160.0000 mg | ORAL_TABLET | Freq: Every day | ORAL | Status: DC

## 2013-10-06 MED ORDER — SITAGLIP PHOS-METFORMIN HCL ER 50-1000 MG PO TB24: 1.0000 | ORAL_TABLET | Freq: Two times a day (BID) | ORAL | Status: DC

## 2013-10-06 MED ORDER — VARDENAFIL HCL 20 MG PO TABS: 20.0000 mg | ORAL_TABLET | Freq: Every day | ORAL | Status: DC | PRN

## 2013-10-06 MED ORDER — ONETOUCH VERIO IQ SYSTEM W/DEVICE KIT: 1.0000 | PACK | Freq: Three times a day (TID) | Status: DC

## 2013-10-06 MED ORDER — FUROSEMIDE 40 MG PO TABS: 40.0000 mg | ORAL_TABLET | Freq: Two times a day (BID) | ORAL | Status: DC

## 2013-10-06 MED ORDER — NEBIVOLOL HCL 10 MG PO TABS: 10.0000 mg | ORAL_TABLET | Freq: Every day | ORAL | Status: DC

## 2013-10-06 MED ORDER — INSULIN DETEMIR 100 UNIT/ML FLEXPEN: 30.0000 [IU] | PEN_INJECTOR | Freq: Every day | SUBCUTANEOUS | Status: DC

## 2013-10-06 MED ORDER — OLMESARTAN MEDOXOMIL-HCTZ 40-25 MG PO TABS: 1.0000 | ORAL_TABLET | Freq: Every day | ORAL | Status: DC

## 2013-10-06 MED ORDER — GLUCOSE BLOOD VI STRP: ORAL_STRIP | Status: DC

## 2013-10-06 MED ORDER — OLMESARTAN MEDOXOMIL 40 MG PO TABS: 40.0000 mg | ORAL_TABLET | Freq: Every day | ORAL | Status: DC

## 2013-10-06 NOTE — Addendum Note (Signed)
Addended by: Janith Lima on: 10/06/2013 05:16 PM   Modules accepted: Orders

## 2013-10-06 NOTE — Assessment & Plan Note (Signed)
His BP is not well controlled I have asked him to have the OSA evaluated and treated He has edema so will change HCTZ to lasix Will stay on the ARB and will add bystolic as well Will check his labs today to look for end organ damage and secondary causes of HTN

## 2013-10-06 NOTE — Assessment & Plan Note (Signed)
Will recheck his A1C and if it is not much improved will add a basal insulin

## 2013-10-06 NOTE — Assessment & Plan Note (Signed)
Will change the HCTZ to lasix to treat the edema Will check his labs today to look for secondary causes of edema

## 2013-10-06 NOTE — Progress Notes (Signed)
Pre visit review using our clinic review tool, if applicable. No additional management support is needed unless otherwise documented below in the visit note. 

## 2013-10-06 NOTE — Patient Instructions (Signed)

## 2013-10-06 NOTE — Progress Notes (Signed)
Subjective:    Patient ID: Jim Schultz, male    DOB: Jul 19, 1960, 53 y.o.   MRN: [id number]  Hypertension This is a chronic problem. The current episode started more than 1 year ago. The problem is unchanged. The problem is uncontrolled. Associated symptoms include peripheral edema. Pertinent negatives include no anxiety, blurred vision, chest pain, headaches, malaise/fatigue, neck pain, orthopnea, palpitations, PND, shortness of breath or sweats. There are no associated agents to hypertension. Past treatments include diuretics and angiotensin blockers. The current treatment provides mild improvement. Compliance problems include exercise and diet.       Review of Systems  Constitutional: Negative.  Negative for fever, chills, malaise/fatigue, diaphoresis, appetite change and fatigue.  Eyes: Negative.  Negative for blurred vision.  Respiratory: Positive for apnea. Negative for cough, choking, chest tightness, shortness of breath and stridor.   Cardiovascular: Positive for leg swelling (edema in both LE's). Negative for chest pain, palpitations, orthopnea and PND.  Gastrointestinal: Negative.  Negative for nausea, vomiting, abdominal pain, diarrhea, constipation and blood in stool.  Endocrine: Negative.   Genitourinary: Negative.  Negative for dysuria, urgency, frequency, hematuria and decreased urine volume.  Musculoskeletal: Negative.  Negative for arthralgias, back pain, gait problem, joint swelling, myalgias, neck pain and neck stiffness.  Skin: Negative.  Negative for rash.  Allergic/Immunologic: Negative.   Neurological: Negative.  Negative for dizziness, tremors, seizures, weakness, light-headedness, numbness and headaches.  Hematological: Negative.  Negative for adenopathy.  Psychiatric/Behavioral: Negative.        Objective:   Physical Exam  Vitals reviewed. Constitutional: He is oriented to person, place, and time. He appears well-developed and well-nourished. No distress.    HENT:  Head: Normocephalic and atraumatic.  Mouth/Throat: Oropharynx is clear and moist. No oropharyngeal exudate.  Eyes: Conjunctivae are normal. Right eye exhibits no discharge. Left eye exhibits no discharge. No scleral icterus.  Neck: Normal range of motion. Neck supple. No JVD present. No tracheal deviation present. No thyromegaly present.  Cardiovascular: Normal rate, regular rhythm, normal heart sounds and intact distal pulses.  Exam reveals no gallop and no friction rub.   No murmur heard. Pulmonary/Chest: Effort normal and breath sounds normal. No stridor. No respiratory distress. He has no wheezes. He has no rales. He exhibits no tenderness.  Abdominal: Soft. Bowel sounds are normal. He exhibits no distension and no mass. There is no tenderness. There is no rebound and no guarding.  Musculoskeletal: Normal range of motion. He exhibits edema (2+ pitting edema in BLE). He exhibits no tenderness.  Lymphadenopathy:    He has no cervical adenopathy.  Neurological: He is oriented to person, place, and time.  Skin: Skin is warm and dry. No rash noted. He is not diaphoretic. No erythema. No pallor.  Psychiatric: He has a normal mood and affect. His behavior is normal. Judgment and thought content normal.     Lab Results  Component Value Date   WBC 7.7 07/24/2013   HGB 15.0 07/24/2013   HCT 43.3 07/24/2013   PLT 204.0 07/24/2013   GLUCOSE 238* 07/24/2013   CHOL 142 07/24/2013   TRIG 306.0* 07/24/2013   HDL 36.90* 07/24/2013   LDLDIRECT 70.4 10/26/2012   LDLCALC 44 07/24/2013   ALT 37 07/24/2013   AST 24 07/24/2013   NA 137 07/24/2013   K 3.9 07/24/2013   CL 103 07/24/2013   CREATININE 0.8 07/24/2013   BUN 14 07/24/2013   CO2 26 07/24/2013   TSH 2.01 07/24/2013   PSA 0.63 06/03/2012   HGBA1C  9.2* 07/24/2013   MICROALBUR 0.4 06/03/2012       Assessment & Plan:

## 2013-11-03 ENCOUNTER — Encounter: Payer: Self-pay | Admitting: Internal Medicine

## 2013-11-03 ENCOUNTER — Ambulatory Visit (INDEPENDENT_AMBULATORY_CARE_PROVIDER_SITE_OTHER): Payer: Federal, State, Local not specified - PPO | Admitting: Internal Medicine

## 2013-11-03 VITALS — BP 160/90 | HR 78 | Temp 98.1°F | Resp 16 | Ht 76.0 in | Wt 290.4 lb

## 2013-11-03 DIAGNOSIS — I10 Essential (primary) hypertension: Secondary | ICD-10-CM

## 2013-11-03 DIAGNOSIS — R609 Edema, unspecified: Secondary | ICD-10-CM

## 2013-11-03 DIAGNOSIS — R0789 Other chest pain: Secondary | ICD-10-CM

## 2013-11-03 DIAGNOSIS — E1149 Type 2 diabetes mellitus with other diabetic neurological complication: Secondary | ICD-10-CM

## 2013-11-03 NOTE — Progress Notes (Signed)
Subjective:    Patient ID: Jim Schultz, male    DOB: 02/13/1961, 53 y.o.   MRN: [id number]  Chest Pain  This is a chronic problem. The current episode started more than 1 year ago. The onset quality is undetermined. The problem occurs intermittently. The problem has been unchanged. Pain location: his entire chest area. The pain is at a severity of 1/10. The pain is mild. The quality of the pain is described as pressure. The pain does not radiate. Associated symptoms include lower extremity edema and malaise/fatigue. Pertinent negatives include no abdominal pain, back pain, claudication, cough, diaphoresis, dizziness, exertional chest pressure, fever, headaches, hemoptysis, irregular heartbeat, leg pain, nausea, near-syncope, numbness, orthopnea, palpitations, PND, shortness of breath, sputum production, syncope, vomiting or weakness. The pain is aggravated by nothing. He has tried nothing for the symptoms. Prior diagnostic workup includes persantine thallium.      Review of Systems  Constitutional: Positive for malaise/fatigue and fatigue. Negative for fever, chills, diaphoresis and appetite change.  HENT: Negative.   Eyes: Negative.   Respiratory: Negative.  Negative for cough, hemoptysis, sputum production, choking, chest tightness, shortness of breath, wheezing and stridor.   Cardiovascular: Positive for chest pain and leg swelling. Negative for palpitations, orthopnea, claudication, syncope, PND and near-syncope.  Gastrointestinal: Negative.  Negative for nausea, vomiting, abdominal pain, constipation and blood in stool.  Endocrine: Negative.   Genitourinary: Negative.   Musculoskeletal: Negative.  Negative for arthralgias, back pain, joint swelling and myalgias.  Skin: Negative.  Negative for rash.  Allergic/Immunologic: Negative.   Neurological: Negative.  Negative for dizziness, syncope, speech difficulty, weakness, light-headedness, numbness and headaches.  Hematological: Negative.   Negative for adenopathy. Does not bruise/bleed easily.  Psychiatric/Behavioral: Negative.        Objective:   Physical Exam  Vitals reviewed. Constitutional: He is oriented to person, place, and time. He appears well-developed and well-nourished. No distress.  HENT:  Head: Normocephalic and atraumatic.  Mouth/Throat: Oropharynx is clear and moist. No oropharyngeal exudate.  Eyes: Conjunctivae are normal. Right eye exhibits no discharge. Left eye exhibits no discharge. No scleral icterus.  Neck: Normal range of motion. Neck supple. No JVD present. No tracheal deviation present. No thyromegaly present.  Cardiovascular: Normal rate, regular rhythm, normal heart sounds and intact distal pulses.  Exam reveals no gallop and no friction rub.   No murmur heard. Pulmonary/Chest: Effort normal and breath sounds normal. No stridor. No respiratory distress. He has no wheezes. He has no rales. He exhibits no tenderness.  Abdominal: Soft. Bowel sounds are normal. He exhibits no distension and no mass. There is no tenderness. There is no rebound and no guarding.  Musculoskeletal: Normal range of motion. He exhibits edema (2+ pitting edema in BLE). He exhibits no tenderness.  Lymphadenopathy:    He has no cervical adenopathy.  Neurological: He is oriented to person, place, and time.  Skin: Skin is warm and dry. No rash noted. He is not diaphoretic. No erythema. No pallor.  Psychiatric: He has a normal mood and affect. His behavior is normal. Judgment and thought content normal.     Lab Results  Component Value Date   WBC 5.6 10/06/2013   HGB 13.8 10/06/2013   HCT 40.6 10/06/2013   PLT 200.0 10/06/2013   GLUCOSE 197* 10/06/2013   CHOL 142 07/24/2013   TRIG 306.0* 07/24/2013   HDL 36.90* 07/24/2013   LDLDIRECT 70.4 10/26/2012   LDLCALC 44 07/24/2013   ALT 30 10/06/2013   AST 20 10/06/2013  NA 137 10/06/2013   K 4.0 10/06/2013   CL 102 10/06/2013   CREATININE 1.0 10/06/2013   BUN 17 10/06/2013   CO2 27  10/06/2013   TSH 1.65 10/06/2013   PSA 0.63 06/03/2012   HGBA1C 8.8* 10/06/2013   MICROALBUR 0.4 06/03/2012       Assessment & Plan:

## 2013-11-03 NOTE — Progress Notes (Signed)
Pre visit review using our clinic review tool, if applicable. No additional management support is needed unless otherwise documented below in the visit note. 

## 2013-11-03 NOTE — Patient Instructions (Signed)

## 2013-11-04 ENCOUNTER — Encounter: Payer: Self-pay | Admitting: Internal Medicine

## 2013-11-04 DIAGNOSIS — R0789 Other chest pain: Secondary | ICD-10-CM | POA: Insufficient documentation

## 2013-11-04 NOTE — Assessment & Plan Note (Signed)
This is related to obesity and poor diet choices He will elevate his LE's more and will improve his lifestyle modifications

## 2013-11-04 NOTE — Assessment & Plan Note (Signed)
He has a vague chest pressure regardless of activity EKG is normal today Lexiscan was normal 1.5 years ago I don't think this is cardiac, will follow for now

## 2013-11-04 NOTE — Assessment & Plan Note (Signed)
His blood sugars are not well controlled He will be more compliant with his med regimen and will improve his lifestyle modifications

## 2013-11-04 NOTE — Assessment & Plan Note (Signed)
His BP is not well controlled He is not willing to increase the doses of his meds or to start any new meds He agrees to work on his lifestyle modifications

## 2013-12-05 ENCOUNTER — Ambulatory Visit: Payer: Federal, State, Local not specified - PPO | Admitting: *Deleted

## 2014-04-09 ENCOUNTER — Other Ambulatory Visit: Payer: Self-pay | Admitting: Internal Medicine

## 2014-04-14 ENCOUNTER — Other Ambulatory Visit: Payer: Self-pay | Admitting: Internal Medicine

## 2014-04-30 ENCOUNTER — Other Ambulatory Visit: Payer: Self-pay

## 2014-04-30 NOTE — Telephone Encounter (Signed)
Rx change request to change from the vial Levemir to the Levemir Flextouch

## 2014-05-01 MED ORDER — INSULIN DETEMIR 100 UNIT/ML FLEXPEN: 30.0000 [IU] | PEN_INJECTOR | Freq: Every day | SUBCUTANEOUS | Status: DC

## 2014-05-01 MED ORDER — "PEN NEEDLES 3/16"" 31G X 5 MM MISC": Status: DC

## 2014-05-22 ENCOUNTER — Other Ambulatory Visit: Payer: Self-pay | Admitting: Internal Medicine

## 2014-09-03 ENCOUNTER — Telehealth: Payer: Self-pay | Admitting: *Deleted

## 2014-09-03 NOTE — Telephone Encounter (Signed)
Left message for patient to call and schedule appointment to have a1c checked.

## 2014-09-24 ENCOUNTER — Other Ambulatory Visit: Payer: Self-pay

## 2014-09-24 DIAGNOSIS — I10 Essential (primary) hypertension: Secondary | ICD-10-CM

## 2014-10-04 ENCOUNTER — Other Ambulatory Visit: Payer: Self-pay

## 2014-10-04 DIAGNOSIS — IMO0002 Reserved for concepts with insufficient information to code with codable children: Secondary | ICD-10-CM

## 2014-10-04 DIAGNOSIS — E1165 Type 2 diabetes mellitus with hyperglycemia: Secondary | ICD-10-CM

## 2014-10-04 DIAGNOSIS — I10 Essential (primary) hypertension: Secondary | ICD-10-CM

## 2014-10-04 DIAGNOSIS — E1149 Type 2 diabetes mellitus with other diabetic neurological complication: Secondary | ICD-10-CM

## 2014-10-04 MED ORDER — FENOFIBRATE 160 MG PO TABS: 160.0000 mg | ORAL_TABLET | Freq: Every day | ORAL | Status: DC

## 2014-10-04 MED ORDER — SITAGLIP PHOS-METFORMIN HCL ER 50-1000 MG PO TB24: 1.0000 | ORAL_TABLET | Freq: Two times a day (BID) | ORAL | Status: DC

## 2014-10-04 MED ORDER — SIMVASTATIN 80 MG PO TABS: 80.0000 mg | ORAL_TABLET | Freq: Every day | ORAL | Status: DC

## 2014-10-04 NOTE — Telephone Encounter (Signed)
Rx approved x 1 with need for appointment noted.

## 2014-11-01 ENCOUNTER — Other Ambulatory Visit: Payer: Self-pay

## 2014-11-01 DIAGNOSIS — I10 Essential (primary) hypertension: Secondary | ICD-10-CM

## 2014-11-01 MED ORDER — FENOFIBRATE 160 MG PO TABS: 160.0000 mg | ORAL_TABLET | Freq: Every day | ORAL | Status: DC

## 2014-11-01 MED ORDER — SIMVASTATIN 80 MG PO TABS: 80.0000 mg | ORAL_TABLET | Freq: Every day | ORAL | Status: DC

## 2014-11-29 ENCOUNTER — Other Ambulatory Visit: Payer: Self-pay

## 2014-11-29 DIAGNOSIS — I10 Essential (primary) hypertension: Secondary | ICD-10-CM

## 2014-12-04 ENCOUNTER — Other Ambulatory Visit: Payer: Self-pay

## 2014-12-04 DIAGNOSIS — I10 Essential (primary) hypertension: Secondary | ICD-10-CM

## 2014-12-04 MED ORDER — SIMVASTATIN 80 MG PO TABS: 80.0000 mg | ORAL_TABLET | Freq: Every day | ORAL | Status: DC

## 2015-01-02 ENCOUNTER — Other Ambulatory Visit: Payer: Self-pay

## 2015-01-02 DIAGNOSIS — E1149 Type 2 diabetes mellitus with other diabetic neurological complication: Secondary | ICD-10-CM

## 2015-01-02 DIAGNOSIS — I10 Essential (primary) hypertension: Secondary | ICD-10-CM

## 2015-01-02 MED ORDER — OLMESARTAN MEDOXOMIL 40 MG PO TABS: 40.0000 mg | ORAL_TABLET | Freq: Every day | ORAL | Status: DC

## 2015-01-09 ENCOUNTER — Telehealth: Payer: Self-pay

## 2015-01-09 NOTE — Telephone Encounter (Signed)
Called and left voice mail for pt to call in to schedule an appt for further refills

## 2015-01-09 NOTE — Telephone Encounter (Signed)
We have been telling this pt he needs an appt since July. Can we get him on the schedule. INcoming request for refills and he has been made it per refills that he needs an apt.

## 2015-01-16 ENCOUNTER — Encounter: Payer: Self-pay | Admitting: Internal Medicine

## 2015-01-16 ENCOUNTER — Other Ambulatory Visit (INDEPENDENT_AMBULATORY_CARE_PROVIDER_SITE_OTHER): Payer: Federal, State, Local not specified - PPO

## 2015-01-16 ENCOUNTER — Ambulatory Visit (INDEPENDENT_AMBULATORY_CARE_PROVIDER_SITE_OTHER): Payer: Federal, State, Local not specified - PPO | Admitting: Internal Medicine

## 2015-01-16 ENCOUNTER — Telehealth: Payer: Self-pay | Admitting: Internal Medicine

## 2015-01-16 VITALS — BP 138/80 | HR 98 | Temp 98.5°F | Resp 20 | Ht 76.0 in | Wt 284.5 lb

## 2015-01-16 DIAGNOSIS — R600 Localized edema: Secondary | ICD-10-CM

## 2015-01-16 DIAGNOSIS — N528 Other male erectile dysfunction: Secondary | ICD-10-CM

## 2015-01-16 DIAGNOSIS — Z794 Long term (current) use of insulin: Secondary | ICD-10-CM

## 2015-01-16 DIAGNOSIS — B351 Tinea unguium: Secondary | ICD-10-CM | POA: Insufficient documentation

## 2015-01-16 DIAGNOSIS — E1165 Type 2 diabetes mellitus with hyperglycemia: Secondary | ICD-10-CM

## 2015-01-16 DIAGNOSIS — IMO0002 Reserved for concepts with insufficient information to code with codable children: Secondary | ICD-10-CM

## 2015-01-16 DIAGNOSIS — N529 Male erectile dysfunction, unspecified: Secondary | ICD-10-CM | POA: Insufficient documentation

## 2015-01-16 DIAGNOSIS — E118 Type 2 diabetes mellitus with unspecified complications: Secondary | ICD-10-CM

## 2015-01-16 DIAGNOSIS — Z0000 Encounter for general adult medical examination without abnormal findings: Secondary | ICD-10-CM

## 2015-01-16 DIAGNOSIS — E1149 Type 2 diabetes mellitus with other diabetic neurological complication: Secondary | ICD-10-CM

## 2015-01-16 DIAGNOSIS — I10 Essential (primary) hypertension: Secondary | ICD-10-CM | POA: Diagnosis not present

## 2015-01-16 DIAGNOSIS — E781 Pure hyperglyceridemia: Secondary | ICD-10-CM

## 2015-01-16 LAB — COMPREHENSIVE METABOLIC PANEL
ALBUMIN: 4.2 g/dL (ref 3.5–5.2)
ALT: 23 U/L (ref 0–53)
AST: 14 U/L (ref 0–37)
Alkaline Phosphatase: 69 U/L (ref 39–117)
BILIRUBIN TOTAL: 0.7 mg/dL (ref 0.2–1.2)
BUN: 15 mg/dL (ref 6–23)
CALCIUM: 9.6 mg/dL (ref 8.4–10.5)
CO2: 26 mEq/L (ref 19–32)
CREATININE: 0.89 mg/dL (ref 0.40–1.50)
Chloride: 100 mEq/L (ref 96–112)
GFR: 94.33 mL/min (ref >60.00)
Glucose, Bld: 313 mg/dL — ABNORMAL HIGH (ref 70–99)
Potassium: 4.2 mEq/L (ref 3.5–5.1)
SODIUM: 134 meq/L — AB (ref 135–145)
Total Protein: 6.7 g/dL (ref 6.0–8.3)

## 2015-01-16 LAB — CBC WITH DIFFERENTIAL/PLATELET
BASOS ABS: 0 10*3/uL (ref 0.0–0.1)
BASOS PCT: 0.5 % (ref 0.0–3.0)
EOS ABS: 0.1 10*3/uL (ref 0.0–0.7)
Eosinophils Relative: 1.9 % (ref 0.0–5.0)
HEMATOCRIT: 44.9 % (ref 39.0–52.0)
HEMOGLOBIN: 15.3 g/dL (ref 13.0–17.0)
LYMPHS PCT: 15.6 % (ref 12.0–46.0)
Lymphs Abs: 0.9 10*3/uL (ref 0.7–4.0)
MCHC: 34.1 g/dL (ref 30.0–36.0)
MCV: 90.8 fl (ref 78.0–100.0)
MONO ABS: 0.5 10*3/uL (ref 0.1–1.0)
Monocytes Relative: 8.6 % (ref 3.0–12.0)
NEUTROS ABS: 4.4 10*3/uL (ref 1.4–7.7)
NEUTROS PCT: 73.4 % (ref 43.0–77.0)
PLATELETS: 230 10*3/uL (ref 150.0–400.0)
RBC: 4.94 Mil/uL (ref 4.22–5.81)
RDW: 13.4 % (ref 11.5–15.5)
WBC: 6 10*3/uL (ref 4.0–10.5)

## 2015-01-16 LAB — HEMOGLOBIN A1C: HEMOGLOBIN A1C: 8.9 % — AB (ref 4.6–6.5)

## 2015-01-16 LAB — URINALYSIS, ROUTINE W REFLEX MICROSCOPIC
Bilirubin Urine: NEGATIVE
HGB URINE DIPSTICK: NEGATIVE
Ketones, ur: NEGATIVE
LEUKOCYTES UA: NEGATIVE
Nitrite: NEGATIVE
PH: 5.5 (ref 5.0–8.0)
RBC / HPF: NONE SEEN (ref 0–?)
SPECIFIC GRAVITY, URINE: 1.025 (ref 1.000–1.030)
TOTAL PROTEIN, URINE-UPE24: NEGATIVE
Urobilinogen, UA: 0.2 (ref 0.0–1.0)
WBC, UA: NONE SEEN (ref 0–?)

## 2015-01-16 LAB — MICROALBUMIN / CREATININE URINE RATIO
Creatinine,U: 101.8 mg/dL
MICROALB UR: 2.6 mg/dL — AB (ref 0.0–1.9)
Microalb Creat Ratio: 2.6 mg/g (ref 0.0–30.0)

## 2015-01-16 LAB — LIPID PANEL
Cholesterol: 219 mg/dL — ABNORMAL HIGH (ref 0–200)
HDL: 31.3 mg/dL — ABNORMAL LOW (ref >39.00)
Total CHOL/HDL Ratio: 7
Triglycerides: 735 mg/dL — ABNORMAL HIGH (ref 0.0–149.0)

## 2015-01-16 LAB — LDL CHOLESTEROL, DIRECT: Direct LDL: 70 mg/dL

## 2015-01-16 LAB — TSH: TSH: 1.23 u[IU]/mL (ref 0.35–4.50)

## 2015-01-16 LAB — PSA: PSA: 0.57 ng/mL (ref 0.10–4.00)

## 2015-01-16 MED ORDER — ONETOUCH VERIO VI SOLN: 1.0000 | Freq: Three times a day (TID) | Status: AC

## 2015-01-16 MED ORDER — SITAGLIP PHOS-METFORMIN HCL ER 50-1000 MG PO TB24: 1.0000 | ORAL_TABLET | Freq: Two times a day (BID) | ORAL | Status: DC

## 2015-01-16 MED ORDER — OLMESARTAN MEDOXOMIL 40 MG PO TABS: 40.0000 mg | ORAL_TABLET | Freq: Every day | ORAL | Status: DC

## 2015-01-16 MED ORDER — FUROSEMIDE 40 MG PO TABS: 40.0000 mg | ORAL_TABLET | Freq: Two times a day (BID) | ORAL | Status: DC

## 2015-01-16 MED ORDER — ONETOUCH VERIO IQ SYSTEM W/DEVICE KIT: 1.0000 | PACK | Freq: Three times a day (TID) | Status: AC

## 2015-01-16 MED ORDER — VARDENAFIL HCL 20 MG PO TABS
20.0000 mg | ORAL_TABLET | Freq: Every day | ORAL | Status: AC | PRN
Start: 2015-01-16 — End: ?

## 2015-01-16 MED ORDER — FUROSEMIDE 40 MG PO TABS
40.0000 mg | ORAL_TABLET | Freq: Two times a day (BID) | ORAL | Status: DC
Start: 2015-01-16 — End: 2015-08-04

## 2015-01-16 MED ORDER — "PEN NEEDLES 3/16"" 31G X 5 MM MISC": Status: AC

## 2015-01-16 MED ORDER — INSULIN DETEMIR 100 UNIT/ML ~~LOC~~ SOLN: 30.0000 [IU] | Freq: Every day | SUBCUTANEOUS | Status: AC

## 2015-01-16 MED ORDER — SIMVASTATIN 80 MG PO TABS: 80.0000 mg | ORAL_TABLET | Freq: Every day | ORAL | Status: DC

## 2015-01-16 MED ORDER — DAPAGLIFLOZIN PROPANEDIOL 10 MG PO TABS
10.0000 mg | ORAL_TABLET | Freq: Every day | ORAL | Status: DC
Start: 2015-01-16 — End: 2015-01-16

## 2015-01-16 MED ORDER — DAPAGLIFLOZIN PROPANEDIOL 10 MG PO TABS: 10.0000 mg | ORAL_TABLET | Freq: Every day | ORAL | Status: DC

## 2015-01-16 MED ORDER — GLUCOSE BLOOD VI STRP: ORAL_STRIP | Status: DC

## 2015-01-16 MED ORDER — VITAMIN D 50 MCG (2000 UT) PO TABS: 2000.0000 [IU] | ORAL_TABLET | Freq: Two times a day (BID) | ORAL | Status: AC

## 2015-01-16 MED ORDER — ASPIRIN EC 81 MG PO TBEC
81.0000 mg | DELAYED_RELEASE_TABLET | Freq: Two times a day (BID) | ORAL | Status: AC
Start: 2015-01-16 — End: ?

## 2015-01-16 NOTE — Telephone Encounter (Signed)
These meds were filled in OV to to appr. Pharmacy

## 2015-01-16 NOTE — Progress Notes (Signed)
Pre visit review using our clinic review tool, if applicable. No additional management support is needed unless otherwise documented below in the visit note. 

## 2015-01-16 NOTE — Progress Notes (Signed)
Subjective:  Patient ID: Jim Schultz, male    DOB: 1960/12/14  Age: 54 y.o. MRN: [id number]  CC: Hypertension; Hyperlipidemia; and Diabetes   HPI Jim Schultz presents for follow-up on multiple medical problems. He has not been compliant with his medical regimen for nearly a year now. He says he has been too busy doing other things. He feels relatively well and only complains of toenails that are thick and uncomfortable and an occasional episode of polyuria.  Outpatient Prescriptions Prior to Visit  Medication Sig Dispense Refill  . insulin detemir (LEVEMIR) 100 UNIT/ML injection Inject 30 Units into the skin at bedtime. 30 units into skin at bedtime    . aspirin EC 81 MG tablet Take 81 mg by mouth 2 (two) times daily.    . Blood Glucose Calibration (ONETOUCH VERIO) SOLN 1 Act by In Vitro route 3 (three) times daily. (Patient not taking: Reported on 01/16/2015) 1 each 11  . Blood Glucose Monitoring Suppl (ONETOUCH VERIO IQ SYSTEM) W/DEVICE KIT 1 Act by Does not apply route 3 (three) times daily. (Patient not taking: Reported on 01/16/2015) 1 kit 2  . Cholecalciferol (VITAMIN D PO) Take 1 tablet by mouth 2 (two) times daily.     . Dapagliflozin Propanediol (FARXIGA) 10 MG TABS Take 1 tablet by mouth daily. 90 tablet 3  . fenofibrate 160 MG tablet Take 1 tablet (160 mg total) by mouth daily. 30 tablet 0  . furosemide (LASIX) 40 MG tablet Take 1 tablet (40 mg total) by mouth 2 (two) times daily. 180 tablet 1  . GLUCOSAMINE-CHONDROITIN PO Take 1 tablet by mouth 2 (two) times daily.    Marland Kitchen glucose blood (ONETOUCH VERIO) test strip Use TID (Patient not taking: Reported on 01/16/2015) 100 each 12  . Insulin Detemir (LEVEMIR FLEXTOUCH) 100 UNIT/ML Pen Inject 30 Units into the skin daily at 10 pm. (Patient not taking: Reported on 01/16/2015) 15 mL 11  . Insulin Pen Needle (PEN NEEDLES 3/16") 31G X 5 MM MISC Use pen needles for injection insulin solution per MD advisement. (Patient not taking: Reported on  01/16/2015) 50 each 5  . Multiple Vitamin (MULTIVITAMIN WITH MINERALS) TABS Take 1 tablet by mouth daily.    . nebivolol (BYSTOLIC) 10 MG tablet Take 1 tablet (10 mg total) by mouth daily. (Patient not taking: Reported on 01/16/2015) 30 tablet 11  . olmesartan (BENICAR) 40 MG tablet Take 1 tablet (40 mg total) by mouth daily. (Patient not taking: Reported on 01/16/2015) 30 tablet 0  . simvastatin (ZOCOR) 80 MG tablet Take 1 tablet (80 mg total) by mouth at bedtime. (Patient not taking: Reported on 01/16/2015) 30 tablet 0  . SitaGLIPtin-MetFORMIN HCl (JANUMET XR) 50-1000 MG TB24 Take 1 tablet by mouth 2 (two) times daily. (Patient not taking: Reported on 01/16/2015) 180 tablet 3  . vardenafil (LEVITRA) 20 MG tablet Take 1 tablet (20 mg total) by mouth daily as needed for erectile dysfunction. (Patient not taking: Reported on 01/16/2015) 30 tablet 11  . VITAMIN E PO Take 1 tablet by mouth daily.     No facility-administered medications prior to visit.    ROS Review of Systems  Constitutional: Positive for unexpected weight change (wt gain). Negative for fever, chills, diaphoresis, appetite change and fatigue.  HENT: Negative.  Negative for sore throat, trouble swallowing and voice change.   Eyes: Negative.   Respiratory: Positive for apnea. Negative for cough, choking, chest tightness, shortness of breath, wheezing and stridor.   Cardiovascular: Negative.  Negative for  chest pain, palpitations and leg swelling.  Gastrointestinal: Negative.  Negative for nausea, vomiting, abdominal pain, diarrhea, constipation and blood in stool.  Endocrine: Positive for polyuria. Negative for polydipsia and polyphagia.  Genitourinary: Negative.  Negative for dysuria and difficulty urinating.  Musculoskeletal: Negative.  Negative for myalgias, back pain, joint swelling and arthralgias.  Skin: Negative.  Negative for rash.  Allergic/Immunologic: Negative.   Neurological: Negative.   Hematological: Negative.  Negative  for adenopathy. Does not bruise/bleed easily.  Psychiatric/Behavioral: Negative.     Objective:  BP 138/80 mmHg  Pulse 98  Temp(Src) 98.5 F (36.9 C) (Oral)  Resp 20  Ht 6' 4" (1.93 m)  Wt 284 lb 8 oz (129.048 kg)  BMI 34.64 kg/m2  SpO2 95%  BP Readings from Last 3 Encounters:  01/16/15 138/80  11/03/13 160/90  10/06/13 162/96    Wt Readings from Last 3 Encounters:  01/16/15 284 lb 8 oz (129.048 kg)  11/03/13 290 lb 6.4 oz (131.725 kg)  10/06/13 285 lb 1.9 oz (129.33 kg)    Physical Exam  Constitutional: He is oriented to person, place, and time. No distress.  HENT:  Head: Normocephalic and atraumatic.  Mouth/Throat: Oropharynx is clear and moist. No oropharyngeal exudate.  Eyes: Conjunctivae are normal. Right eye exhibits no discharge. Left eye exhibits no discharge. No scleral icterus.  Neck: Normal range of motion. Neck supple. No JVD present. No tracheal deviation present. No thyromegaly present.  Cardiovascular: Normal rate, regular rhythm, normal heart sounds and intact distal pulses.  Exam reveals no gallop and no friction rub.   No murmur heard. Pulmonary/Chest: Effort normal and breath sounds normal. No stridor. No respiratory distress. He has no wheezes. He has no rales. He exhibits no tenderness.  Abdominal: Soft. Bowel sounds are normal. He exhibits no distension and no mass. There is no tenderness. There is no rebound and no guarding.  Musculoskeletal: Normal range of motion. He exhibits edema (1+ pitting edema in BLE). He exhibits no tenderness.  There appears to be onychomycosis on all of his toenails. There is nail bed thickening with lysis and subungual debris. There is no evidence of secondary infection with there being no erythema, swelling, tenderness, or exudate.  Lymphadenopathy:    He has no cervical adenopathy.  Neurological: He is oriented to person, place, and time.  Skin: Skin is warm and dry. No rash noted. He is not diaphoretic. No erythema. No  pallor.  Psychiatric: He has a normal mood and affect. His behavior is normal. Judgment and thought content normal.    Lab Results  Component Value Date   WBC 6.0 01/16/2015   HGB 15.3 01/16/2015   HCT 44.9 01/16/2015   PLT 230.0 01/16/2015   GLUCOSE 313* 01/16/2015   CHOL 219* 01/16/2015   TRIG * 01/16/2015    735.0 Triglyceride is over 400; calculations on Lipids are invalid.   HDL 31.30* 01/16/2015   LDLDIRECT 70.0 01/16/2015   LDLCALC 44 07/24/2013   ALT 23 01/16/2015   AST 14 01/16/2015   NA 134* 01/16/2015   K 4.2 01/16/2015   CL 100 01/16/2015   CREATININE 0.89 01/16/2015   BUN 15 01/16/2015   CO2 26 01/16/2015   TSH 1.23 01/16/2015   PSA 0.57 01/16/2015   HGBA1C 8.9* 01/16/2015   MICROALBUR 2.6* 01/16/2015    No results found.  Assessment & Plan:   Jim Schultz was seen today for hypertension, hyperlipidemia and diabetes.  Diagnoses and all orders for this visit:  Type 2 diabetes  mellitus with complication, without long-term current use of insulin ([place])- his blood sugars are not well-controlled, I have asked him to have his annual eye exam done and will restart his previous medications to control his blood sugars. I've also asked him to be seen for diabetic education. -     Hemoglobin A1c; Future -     Microalbumin / creatinine urine ratio; Future -     Ambulatory referral to Ophthalmology -     Ambulatory referral to Podiatry -     Amb Referral to Nutrition and Diabetic E -     Discontinue: olmesartan (BENICAR) 40 MG tablet; Take 1 tablet (40 mg total) by mouth daily. -     Discontinue: SitaGLIPtin-MetFORMIN HCl (JANUMET XR) 50-1000 MG TB24; Take 1 tablet by mouth 2 (two) times daily. -     Discontinue: dapagliflozin propanediol (FARXIGA) 10 MG TABS tablet; Take 10 mg by mouth daily. -     Blood Glucose Monitoring Suppl (ONETOUCH VERIO IQ SYSTEM) W/DEVICE KIT; 1 Act by Does not apply route 3 (three) times daily. -     SitaGLIPtin-MetFORMIN HCl (JANUMET XR) 50-1000  MG TB24; Take 1 tablet by mouth 2 (two) times daily. -     dapagliflozin propanediol (FARXIGA) 10 MG TABS tablet; Take 10 mg by mouth daily. -     insulin detemir (LEVEMIR) 100 UNIT/ML injection; Inject 0.3 mLs (30 Units total) into the skin at bedtime. 30 units into skin at bedtime -     olmesartan (BENICAR) 40 MG tablet; Take 1 tablet (40 mg total) by mouth daily. -     simvastatin (ZOCOR) 80 MG tablet; Take 1 tablet (80 mg total) by mouth at bedtime. -     aspirin EC 81 MG tablet; Take 1 tablet (81 mg total) by mouth 2 (two) times daily. -     Insulin Pen Needle (PEN NEEDLES 3/16") 31G X 5 MM MISC; Use pen needles for injection insulin solution per MD advisement. -     glucose blood (ONETOUCH VERIO) test strip; Use TID  Essential hypertension, benign- we will restart Lasix for the peripheral edema, will restart an ARB for blood pressure control and renal protection. His electrolytes and renal function are stable. -     Discontinue: olmesartan (BENICAR) 40 MG tablet; Take 1 tablet (40 mg total) by mouth daily. -     Discontinue: furosemide (LASIX) 40 MG tablet; Take 1 tablet (40 mg total) by mouth 2 (two) times daily. -     furosemide (LASIX) 40 MG tablet; Take 1 tablet (40 mg total) by mouth 2 (two) times daily. -     olmesartan (BENICAR) 40 MG tablet; Take 1 tablet (40 mg total) by mouth daily.  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 -     olmesartan  (BENICAR) 40 MG tablet; Take 1 tablet (  40 mg total) by mouth daily.  Localized edema- we'll restart Lasix -     Discontinue: furosemide (LASIX) 40 MG tablet; Take 1 tablet (40 mg total) by mouth 2 (two) times daily. -     furosemide (LASIX) 40 MG tablet; Take 1 tablet (40 mg total) by mouth 2 (two) times daily.  Diabetes mellitus with neurological manifestations, uncontrolled ([place]) -     Discontinue: SitaGLIPtin-MetFORMIN HCl (JANUMET XR) 50-1000 MG TB24; Take 1 tablet by mouth 2 (two) times daily. -     SitaGLIPtin-MetFORMIN HCl (JANUMET XR) 50-1000 MG TB24; Take 1 tablet by mouth 2 (two) times daily.  Type 2 diabetes mellitus with complication, with long-term current use of insulin ([facility]) -     Discontinue: dapagliflozin propanediol (FARXIGA) 10 MG TABS tablet; Take 10 mg by mouth daily. -     Blood Glucose Calibration (ONETOUCH VERIO) SOLN; 1 Act by In Vitro route 3 (three) times daily. -     Blood Glucose Monitoring Suppl (ONETOUCH VERIO IQ SYSTEM) W/DEVICE KIT; 1 Act by Does not apply route 3 (three) times daily. -     dapagliflozin propanediol (FARXIGA) 10 MG TABS tablet; Take 10 mg by mouth daily. -     glucose blood (ONETOUCH VERIO) test strip; Use TID  Erectile dysfunction of organic origin -     vardenafil (LEVITRA) 20 MG tablet; Take 1 tablet (20 mg total) by mouth daily as needed for erectile dysfunction.  Hypertension, essential -     simvastatin (ZOCOR) 80 MG tablet; Take 1 tablet (80 mg total) by mouth at bedtime.  Hypertriglyceridemia- he is taking high-dose of simvastatin so I am not comfortable prescribing a fenofibrate. Instead I have asked him to control this with omega-3 fish oils. -     Icosapent Ethyl (VASCEPA) 1 G CAPS; Take 2 capsules by mouth 2 (two) times daily.  Other orders -     Cholecalciferol (VITAMIN D) 2000 UNITS tablet; Take 1 tablet (2,000 Units total) by mouth 2 (two) times daily.   I have discontinued Jim Schultz multivitamin with minerals,  GLUCOSAMINE-CHONDROITIN PO, VITAMIN E PO, nebivolol, Insulin Detemir, and fenofibrate. I have changed his Cholecalciferol (VITAMIN D PO) to Cholecalciferol (VITAMIN D) 2000 UNITS tablet. I have also changed his insulin detemir and aspirin EC. Additionally, I am having him start on Icosapent Ethyl. Lastly, I am having him maintain his vardenafil, ONETOUCH VERIO, ONETOUCH VERIO IQ SYSTEM, SitaGLIPtin-MetFORMIN HCl, dapagliflozin propanediol, furosemide, olmesartan, simvastatin, Pen Needles 3/16", and glucose blood.  Meds ordered this encounter  Medications  . DISCONTD: olmesartan (BENICAR) 40 MG tablet    Sig: Take 1 tablet (40 mg total) by mouth daily.    Dispense:  90 tablet    Refill:  1  . DISCONTD: furosemide (LASIX) 40 MG tablet    Sig: Take 1 tablet (40 mg total) by mouth 2 (two) times daily.    Dispense:  180 tablet    Refill:  1  . DISCONTD: SitaGLIPtin-MetFORMIN HCl (JANUMET XR) 50-1000 MG TB24    Sig: Take 1 tablet by mouth 2 (two) times daily.    Dispense:  180 tablet    Refill:  1  . DISCONTD: dapagliflozin propanediol (FARXIGA) 10 MG TABS tablet    Sig: Take 10 mg by mouth daily.    Dispense:  90 tablet    Refill:  1  . vardenafil (LEVITRA) 20 MG tablet    Sig: Take 1 tablet (20 mg total) by mouth daily as needed for erectile dysfunction.  Dispense:  30 tablet    Refill:  5  . Blood Glucose Calibration (ONETOUCH VERIO) SOLN    Sig: 1 Act by In Vitro route 3 (three) times daily.    Dispense:  1 each    Refill:  11  . Blood Glucose Monitoring Suppl (ONETOUCH VERIO IQ SYSTEM) W/DEVICE KIT    Sig: 1 Act by Does not apply route 3 (three) times daily.    Dispense:  1 kit    Refill:  2  . SitaGLIPtin-MetFORMIN HCl (JANUMET XR) 50-1000 MG TB24    Sig: Take 1 tablet by mouth 2 (two) times daily.    Dispense:  180 tablet    Refill:  1  . Cholecalciferol (VITAMIN D) 2000 UNITS tablet    Sig: Take 1 tablet (2,000 Units total) by mouth 2 (two) times daily.    Dispense:  90  tablet    Refill:  3  . dapagliflozin propanediol (FARXIGA) 10 MG TABS tablet    Sig: Take 10 mg by mouth daily.    Dispense:  90 tablet    Refill:  1  . insulin detemir (LEVEMIR) 100 UNIT/ML injection    Sig: Inject 0.3 mLs (30 Units total) into the skin at bedtime. 30 units into skin at bedtime    Dispense:  10 mL    Refill:  5  . furosemide (LASIX) 40 MG tablet    Sig: Take 1 tablet (40 mg total) by mouth 2 (two) times daily.    Dispense:  180 tablet    Refill:  1  . olmesartan (BENICAR) 40 MG tablet    Sig: Take 1 tablet (40 mg total) by mouth daily.    Dispense:  90 tablet    Refill:  1  . simvastatin (ZOCOR) 80 MG tablet    Sig: Take 1 tablet (80 mg total) by mouth at bedtime.    Dispense:  30 tablet    Refill:  0    PT must schedule an appt for further refills  . aspirin EC 81 MG tablet    Sig: Take 1 tablet (81 mg total) by mouth 2 (two) times daily.    Dispense:  90 tablet    Refill:  3  . Insulin Pen Needle (PEN NEEDLES 3/16") 31G X 5 MM MISC    Sig: Use pen needles for injection insulin solution per MD advisement.    Dispense:  50 each    Refill:  5    Levemir FlexTouch Pen Needles needed.  Marland Kitchen glucose blood (ONETOUCH VERIO) test strip    Sig: Use TID    Dispense:  100 each    Refill:  12  . Icosapent Ethyl (VASCEPA) 1 G CAPS    Sig: Take 2 capsules by mouth 2 (two) times daily.    Dispense:  120 capsule    Refill:  11     Follow-up: Return in about 3 months (around 04/18/2015).  Scarlette Calico, MD

## 2015-01-16 NOTE — Patient Instructions (Signed)

## 2015-01-16 NOTE — Telephone Encounter (Signed)
Patient walked in advising that [facility] i [facility] one[facility] on [id number]   Additionally,  He advises that the vardenafil (LEVITRA) 20 MG tablet needs to be filled. He states that he isnt sure why it was dicontinued, and that he does need it./

## 2015-01-17 ENCOUNTER — Encounter: Payer: Self-pay | Admitting: Internal Medicine

## 2015-01-17 DIAGNOSIS — E781 Pure hyperglyceridemia: Secondary | ICD-10-CM | POA: Insufficient documentation

## 2015-01-17 LAB — HEPATITIS C ANTIBODY: HCV AB: NEGATIVE

## 2015-01-17 MED ORDER — ICOSAPENT ETHYL 1 G PO CAPS: 2.0000 | ORAL_CAPSULE | Freq: Two times a day (BID) | ORAL | Status: DC

## 2015-01-18 ENCOUNTER — Telehealth: Payer: Self-pay

## 2015-01-18 NOTE — Telephone Encounter (Signed)
PA initiated for farxiga. Medication was denied, is there another medication that you would like to prescribed for the patient

## 2015-01-18 NOTE — Telephone Encounter (Signed)
PA for farxiga was denied, is there another medication that you would like to put him on

## 2015-01-18 NOTE — Telephone Encounter (Signed)
PA initiated via covermymeds. Key:[id number]

## 2015-01-19 NOTE — Telephone Encounter (Signed)
Do they have an approved alternative in the class?

## 2015-01-20 ENCOUNTER — Emergency Department (HOSPITAL_BASED_OUTPATIENT_CLINIC_OR_DEPARTMENT_OTHER)
Admission: EM | Admit: 2015-01-20 | Discharge: 2015-01-20 | Disposition: A | Discharge status: Home / Self Care | Payer: Federal, State, Local not specified - PPO | Attending: Emergency Medicine | Admitting: Emergency Medicine

## 2015-01-20 ENCOUNTER — Encounter (HOSPITAL_BASED_OUTPATIENT_CLINIC_OR_DEPARTMENT_OTHER): Payer: Self-pay | Admitting: Emergency Medicine

## 2015-01-20 ENCOUNTER — Emergency Department (HOSPITAL_BASED_OUTPATIENT_CLINIC_OR_DEPARTMENT_OTHER): Payer: Federal, State, Local not specified - PPO

## 2015-01-20 DIAGNOSIS — M1990 Unspecified osteoarthritis, unspecified site: Secondary | ICD-10-CM | POA: Insufficient documentation

## 2015-01-20 DIAGNOSIS — Z794 Long term (current) use of insulin: Secondary | ICD-10-CM | POA: Insufficient documentation

## 2015-01-20 DIAGNOSIS — E119 Type 2 diabetes mellitus without complications: Secondary | ICD-10-CM | POA: Insufficient documentation

## 2015-01-20 DIAGNOSIS — Z79899 Other long term (current) drug therapy: Secondary | ICD-10-CM | POA: Diagnosis not present

## 2015-01-20 DIAGNOSIS — Z7984 Long term (current) use of oral hypoglycemic drugs: Secondary | ICD-10-CM | POA: Insufficient documentation

## 2015-01-20 DIAGNOSIS — Z87442 Personal history of urinary calculi: Secondary | ICD-10-CM | POA: Insufficient documentation

## 2015-01-20 DIAGNOSIS — Z87891 Personal history of nicotine dependence: Secondary | ICD-10-CM | POA: Insufficient documentation

## 2015-01-20 DIAGNOSIS — I10 Essential (primary) hypertension: Secondary | ICD-10-CM | POA: Diagnosis not present

## 2015-01-20 DIAGNOSIS — L03116 Cellulitis of left lower limb: Secondary | ICD-10-CM | POA: Insufficient documentation

## 2015-01-20 DIAGNOSIS — Z7982 Long term (current) use of aspirin: Secondary | ICD-10-CM | POA: Diagnosis not present

## 2015-01-20 DIAGNOSIS — E785 Hyperlipidemia, unspecified: Secondary | ICD-10-CM | POA: Diagnosis not present

## 2015-01-20 MED ORDER — CEPHALEXIN 500 MG PO CAPS: 500.0000 mg | ORAL_CAPSULE | Freq: Four times a day (QID) | ORAL | Status: DC

## 2015-01-20 MED ORDER — SULFAMETHOXAZOLE-TRIMETHOPRIM 800-160 MG PO TABS
1.0000 | ORAL_TABLET | Freq: Once | ORAL | Status: AC
  Administered 2015-01-20: 1 via ORAL
  Filled 2015-01-20: qty 1

## 2015-01-20 MED ORDER — ACETAMINOPHEN 500 MG PO TABS
1000.0000 mg | ORAL_TABLET | Freq: Once | ORAL | Status: AC
  Administered 2015-01-20: 1000 mg via ORAL
  Filled 2015-01-20: qty 2

## 2015-01-20 MED ORDER — SULFAMETHOXAZOLE-TRIMETHOPRIM 800-160 MG PO TABS: 1.0000 | ORAL_TABLET | Freq: Two times a day (BID) | ORAL | Status: DC

## 2015-01-20 MED ORDER — IBUPROFEN 800 MG PO TABS
800.0000 mg | ORAL_TABLET | Freq: Once | ORAL | Status: AC
  Administered 2015-01-20: 800 mg via ORAL
  Filled 2015-01-20: qty 1

## 2015-01-20 MED ORDER — CEPHALEXIN 250 MG PO CAPS
500.0000 mg | ORAL_CAPSULE | Freq: Once | ORAL | Status: AC
  Administered 2015-01-20: 500 mg via ORAL
  Filled 2015-01-20: qty 2

## 2015-01-20 MED ORDER — OXYCODONE HCL 5 MG PO TABS
5.0000 mg | ORAL_TABLET | Freq: Once | ORAL | Status: AC
  Administered 2015-01-20: 5 mg via ORAL
  Filled 2015-01-20: qty 1

## 2015-01-20 NOTE — Discharge Instructions (Signed)
Follow up with your family doctor. Tightly control your diabetes to help this. Ensure that you are taking your blood pressure medications. Follow with your family doctor or here in 62 hours for recheck Cellulitis Cellulitis is an infection of the skin and the tissue beneath it. The infected area is usually red and tender. Cellulitis occurs most often in the arms and lower legs.  CAUSES  Cellulitis is caused by bacteria that enter the skin through cracks or cuts in the skin. The most common types of bacteria that cause cellulitis are staphylococci and streptococci. SIGNS AND SYMPTOMS   Redness and warmth.  Swelling.  Tenderness or pain.  Fever. DIAGNOSIS  Your health care provider can usually determine what is wrong based on a physical exam. Blood tests may also be done. TREATMENT  Treatment usually involves taking an antibiotic medicine. HOME CARE INSTRUCTIONS   Take your antibiotic medicine as directed by your health care provider. Finish the antibiotic even if you start to feel better.  Keep the infected arm or leg elevated to reduce swelling.  Apply a warm cloth to the affected area up to 4 times per day to relieve pain.  Take medicines only as directed by your health care provider.  Keep all follow-up visits as directed by your health care provider. SEEK MEDICAL CARE IF:   You notice red streaks coming from the infected area.  Your red area gets larger or turns dark in color.  Your bone or joint underneath the infected area becomes painful after the skin has healed.  Your infection returns in the same area or another area.  You notice a swollen bump in the infected area.  You develop new symptoms.  You have a fever. SEEK IMMEDIATE MEDICAL CARE IF:   You feel very sleepy.  You develop vomiting or diarrhea.  You have a general ill feeling (malaise) with muscle aches and pains.   This information is not intended to replace advice given to you by your health care  provider. Make sure you discuss any questions you have with your health care provider.   Document Released: 12/03/2004 Document Revised: 11/14/2014 Document Reviewed: 05/11/2011 Elsevier Interactive Patient Education [employer].

## 2015-01-20 NOTE — ED Notes (Signed)
Pt in with abscess/cellulitis to RLE, closed and non-draining. Pt with hx of DM.

## 2015-01-20 NOTE — ED Notes (Signed)
Reported blood pressure to EDP, no new orders received.

## 2015-01-20 NOTE — ED Provider Notes (Signed)
CSN: [id number]     Arrival date & time 01/20/15  2043 History  By signing my name below, I, Jim Schultz, attest that this documentation has been prepared under the direction and in the presence of Jim Etienne, DO. Electronically Signed: Helane Schultz, ED Scribe. 01/20/2015. 9:14 PM.    Chief Complaint  Patient presents with  . Cellulitis   The history is provided by the patient. No language interpreter was used.   HPI Comments: Jim Schultz is a 54 y.o. male former smoker with a PMHx of DM, HLD, and HTN who presents to the Emergency Department complaining of painful, worsening cellulitis to the RLE onset 1 week ago. Pt states it began as a blister on the mid-left shin that progressively grew in size. He reports associated erythema, warmth, and swelling to the area. He states he was putting in shelves a week ago and got a splinter in his finger, and believes he may have gotten one in the leg as well. He notes he usually has leg swelling at baseline and was given diuretics by his PCP for this. He states he is on Janumet and Levemir for his DM. Pt denies fever, chills, and n/v.  Past Medical History  Diagnosis Date  . Diabetes mellitus without complication ([place])   . Arthritis   . Allergy   . Kidney stones   . Hyperlipidemia   . Hypertension    Past Surgical History  Procedure Laterality Date  . Total hip arthroplasty    . Shoulder surgery      bil.  . Knee surgery     Family History  Problem Relation Age of Onset  . Cancer Mother     Thyroid Cancer  . Alcohol abuse Other   . Drug abuse Other   . Arthritis Other   . Cancer Other     Breast, Prostate Cancer  . Heart disease Other   . Hypertension Other   . Hyperlipidemia Other   . Colon cancer Neg Hx    Social History  Substance Use Topics  . Smoking status: Former Research scientist (life sciences)  . Smokeless tobacco: Current User    Types: Chew  . Alcohol Use: No    Review of Systems  Constitutional: Negative for fever and chills.  HENT:  Negative for congestion and facial swelling.   Eyes: Negative for discharge and visual disturbance.  Respiratory: Negative for shortness of breath.   Cardiovascular: Positive for leg swelling (baseline). Negative for chest pain and palpitations.  Gastrointestinal: Negative for nausea, vomiting, abdominal pain and diarrhea.  Musculoskeletal: Negative for myalgias and arthralgias.  Skin: Positive for color change. Negative for rash.  Neurological: Negative for tremors, syncope and headaches.  Psychiatric/Behavioral: Negative for confusion and dysphoric mood.    Allergies  Review of patient's allergies indicates no known allergies.  Home Medications   Prior to Admission medications   Medication Sig Start Date End Date Taking? Authorizing Provider  aspirin EC 81 MG tablet Take 1 tablet (81 mg total) by mouth 2 (two) times daily. 01/16/15   Janith Lima, MD  Blood Glucose Calibration (ONETOUCH VERIO) SOLN 1 Act by In Vitro route 3 (three) times daily. 01/16/15   Janith Lima, MD  Blood Glucose Monitoring Suppl (ONETOUCH VERIO IQ SYSTEM) W/DEVICE KIT 1 Act by Does not apply route 3 (three) times daily. 01/16/15   Janith Lima, MD  cephALEXin (KEFLEX) 500 MG capsule Take 1 capsule (500 mg total) by mouth 4 (four) times daily. 01/20/15  Jim Etienne, DO  Cholecalciferol (VITAMIN D) 2000 UNITS tablet Take 1 tablet (2,000 Units total) by mouth 2 (two) times daily. 01/16/15   Janith Lima, MD  dapagliflozin propanediol (FARXIGA) 10 MG TABS tablet Take 10 mg by mouth daily. 01/16/15   Janith Lima, MD  furosemide (LASIX) 40 MG tablet Take 1 tablet (40 mg total) by mouth 2 (two) times daily. 01/16/15   Janith Lima, MD  glucose blood [facility] VERIO) test strip Use TID 01/16/15   Janith Lima, MD  Icosapent Ethyl (VASCEPA) 1 G CAPS Take 2 capsules by mouth 2 (two) times daily. 01/17/15   Janith Lima, MD  insulin detemir (LEVEMIR) 100 UNIT/ML injection Inject 0.3 mLs (30 Units total) into the  skin at bedtime. 30 units into skin at bedtime 01/16/15   Janith Lima, MD  Insulin Pen Needle (PEN NEEDLES 3/16") 31G X 5 MM MISC Use pen needles for injection insulin solution per MD advisement. 01/16/15   Janith Lima, MD  olmesartan (BENICAR) 40 MG tablet Take 1 tablet (40 mg total) by mouth daily. 01/16/15   Janith Lima, MD  simvastatin (ZOCOR) 80 MG tablet Take 1 tablet (80 mg total) by mouth at bedtime. 01/16/15   Janith Lima, MD  SitaGLIPtin-MetFORMIN HCl (JANUMET XR) 50-1000 MG TB24 Take 1 tablet by mouth 2 (two) times daily. 01/16/15   Janith Lima, MD  sulfamethoxazole-trimethoprim (BACTRIM DS,SEPTRA DS) 800-160 MG tablet Take 1 tablet by mouth 2 (two) times daily. 01/20/15   Jim Etienne, DO  vardenafil (LEVITRA) 20 MG tablet Take 1 tablet (20 mg total) by mouth daily as needed for erectile dysfunction. 01/16/15   Janith Lima, MD   BP 175/98 mmHg  Pulse 82  Temp(Src) 97.7 F (36.5 C) (Oral)  Resp 18  Ht 6' 4"  (1.93 m)  Wt 284 lb (128.822 kg)  BMI 34.58 kg/m2  SpO2 98% Physical Exam  Constitutional: He is oriented to person, place, and time. He appears well-developed and well-nourished.  HENT:  Head: Normocephalic and atraumatic.  Eyes: EOM are normal. Pupils are equal, round, and reactive to light.  Neck: Normal range of motion. Neck supple. No JVD present.  Cardiovascular: Normal rate and regular rhythm.  Exam reveals no gallop and no friction rub.   No murmur heard. Pulmonary/Chest: No respiratory distress. He has no wheezes.  Abdominal: He exhibits no distension. There is no rebound and no guarding.  Musculoskeletal: Normal range of motion. He exhibits edema.  LLE swelling is greater than the RLE swelling  Neurological: He is alert and oriented to person, place, and time.  Skin: Skin is warm. No rash noted. There is erythema. No pallor.  Vesicular lesion to the anterior R shin, filled with purulent material, that has surrounding erythema and edema. Posterior  sensation and distal pulses are intact.  Psychiatric: He has a normal mood and affect. His behavior is normal.  Nursing note and vitals reviewed.   ED Course  Procedures  DIAGNOSTIC STUDIES: Oxygen Saturation is 100% on RA, normal by my interpretation.    COORDINATION OF CARE: 9:09 PM - Discussed plans to order an XR and an [place]. Will most likely perform I&D and prescribe antibiotics after XR has resulted.  Pt advised of plan for treatment and pt agrees.  Labs Review Labs Reviewed - No data to display  Imaging Review Dg Tibia/fibula Left  01/20/2015  CLINICAL DATA:  Left leg pain and swelling. Cellulitis and blister/bleb. Evaluate for  necrotizing fasciitis. EXAM: LEFT TIBIA AND FIBULA - 2 VIEW COMPARISON:  None. FINDINGS: There is diffuse subcutaneous edema of the left lower extremity. No tracking soft tissue air to suggest necrotizing fasciitis. Linear 1.6 cm density just anterior to the mid tibial cortex, seen only on the lateral views. Multifocal vascular calcifications. No acute osseous abnormality, no fracture, dislocation, osseous erosion or periosteal reaction. IMPRESSION: 1. Diffuse subcutaneous edema. No tracking soft tissue air to suggest necrotizing fasciitis. 2. Linear density just anterior to the mid tibial cortex, nonspecific. This may reflect foreign body versus soft tissue calcification. Electronically Signed   By: Jeb Levering M.D.   On: 01/20/2015 22:07   I have personally reviewed and evaluated these images and lab results as part of my medical decision-making.   EKG Interpretation None       Emergency Focused Ultrasound Exam Limited Ultrasound of Soft Tissue   Performed and interpreted by Dr. Tyrone Nine Indication: evaluation for infection or foreign body Transverse and Sagittal views of  are obtained in real time for the purposes of evaluation of skin and underlying soft tissues.  Findings:  No heterogeneous fluid collection,  hyperemia/edema of surrounding  tissue Interpretation: no abscess, with cellulitis Images archived electronically.  CPT Codes:   Lower extremity [id number]     MDM   Final diagnoses:  Cellulitis of left lower extremity    54 yo M with a chief complaint of left lower extremity edema and erythema, This started a couple days ago. Patient has normal lower extremity edema as well as a history of poorly controlled diabetes secondary to noncompliance. Patient denies fevers or chills denies nausea or vomiting. Start initially with a vesicle now appears to be filled with purulence. No noted crepitus. X-rays performed that shows no signs of necrotizing fasciitis. Ultrasound without abscess treat with antibiotics.  Blister ruptured during [place], mild purulent drainage.  No deep abscess.   PCP follow up.  11:14 PM:  I have discussed the diagnosis/risks/treatment options with the patient and family and believe the pt to be eligible for discharge home to follow-up with PCP. We also discussed returning to the ED immediately if new or worsening sx occur. We discussed the sx which are most concerning (e.g., sudden worsening pain, fever, inability to tolerate by mouth) that necessitate immediate return. Medications administered to the patient during their visit and any new prescriptions provided to the patient are listed below.  Medications given during this visit Medications  acetaminophen (TYLENOL) tablet 1,000 mg (1,000 mg Oral Given 01/20/15 2135)  ibuprofen (ADVIL,MOTRIN) tablet 800 mg (800 mg Oral Given 01/20/15 2135)  oxyCODONE (Oxy IR/ROXICODONE) immediate release tablet 5 mg (5 mg Oral Given 01/20/15 2135)  cephALEXin (KEFLEX) capsule 500 mg (500 mg Oral Given 01/20/15 2300)  sulfamethoxazole-trimethoprim (BACTRIM DS,SEPTRA DS) 800-160 MG per tablet 1 tablet (1 tablet Oral Given 01/20/15 2300)    Discharge Medication List as of 01/20/2015 10:21 PM    START taking these medications   Details  cephALEXin (KEFLEX) 500 MG capsule  Take 1 capsule (500 mg total) by mouth 4 (four) times daily., Starting 01/20/2015, Until Discontinued, Print    sulfamethoxazole-trimethoprim (BACTRIM DS,SEPTRA DS) 800-160 MG tablet Take 1 tablet by mouth 2 (two) times daily., Starting 01/20/2015, Until Discontinued, Print        The patient appears reasonably screen and/or stabilized for discharge and I doubt any other medical condition or other [facility] requiring further screening, evaluation, or treatment in the ED at this time prior to discharge.  I personally performed the services described in this documentation, which was scribed in my presence. The recorded information has been reviewed and is accurate.    Jim Etienne, DO 01/20/15 2314

## 2015-01-21 NOTE — Telephone Encounter (Signed)
There is an alternative of [place] with his policy

## 2015-01-22 ENCOUNTER — Encounter: Payer: Self-pay | Admitting: Family

## 2015-01-22 ENCOUNTER — Ambulatory Visit (INDEPENDENT_AMBULATORY_CARE_PROVIDER_SITE_OTHER): Payer: Federal, State, Local not specified - PPO | Admitting: Family

## 2015-01-22 VITALS — BP 160/102 | HR 118 | Temp 98.4°F | Resp 18 | Ht 76.0 in | Wt 276.0 lb

## 2015-01-22 DIAGNOSIS — L03116 Cellulitis of left lower limb: Secondary | ICD-10-CM | POA: Diagnosis not present

## 2015-01-22 DIAGNOSIS — L0390 Cellulitis, unspecified: Secondary | ICD-10-CM | POA: Insufficient documentation

## 2015-01-22 NOTE — Progress Notes (Signed)
Pre visit review using our clinic review tool, if applicable. No additional management support is needed unless otherwise documented below in the visit note.  Pt refused flu shot

## 2015-01-22 NOTE — Assessment & Plan Note (Signed)
Symptoms of cellulitis improving with current regimen of Keflex and Bactrim. Wound cleansed with soap and water and dressed with bandage. Procedure tolerated without difficulties. Instructions on basic wound care provided. Continue current dosages of Keflex and Bactrim until completed. Follow-up if symptoms worsen or fail to improve.

## 2015-01-22 NOTE — Progress Notes (Signed)
Subjective:    Patient ID: Jim Schultz, male    DOB: 1960/11/03, 54 y.o.   MRN: [id number]  Chief Complaint  Patient presents with  . Hospitalization Follow-up    cellulitis of left leg    HPI:  Jim Schultz is a 54 y.o. male who  has a past medical history of Diabetes mellitus without complication ([place]); Arthritis; Allergy; Kidney stones; Hyperlipidemia; and Hypertension. and presents today for an office follow up after ED.    Recently seen and evaluated in the emergency department with pain and worsening cellulitis which began as a blister on the mid left shin that progressively grew in size. Physical exam positive for leg swelling and color change with a vesicular lesion to the anterior shin with purulent material noted. A limited focused ultrasound exam showed no heterogeneous fluid collection with no abscess and cellulitis. He was discharged on cephalexin and bactrim ds.  All ED records were reviewed in detail.  Since leaving the ED he has noted improvement in his cellulitis. Takes both medications as prescribed and notes some mild nausea. Keeping it clean with soap and water and having to change the dressing about 2 times per day. Continues to experience mild pain while on his feet for extended periods of time. Denies fevers, chills or worsening of swelling.   No Known Allergies   Current Outpatient Prescriptions on File Prior to Visit  Medication Sig Dispense Refill  . aspirin EC 81 MG tablet Take 1 tablet (81 mg total) by mouth 2 (two) times daily. 90 tablet 3  . Blood Glucose Calibration (ONETOUCH VERIO) SOLN 1 Act by In Vitro route 3 (three) times daily. 1 each 11  . Blood Glucose Monitoring Suppl (ONETOUCH VERIO IQ SYSTEM) W/DEVICE KIT 1 Act by Does not apply route 3 (three) times daily. 1 kit 2  . cephALEXin (KEFLEX) 500 MG capsule Take 1 capsule (500 mg total) by mouth 4 (four) times daily. 40 capsule 0  . Cholecalciferol (VITAMIN D) 2000 UNITS tablet Take 1 tablet (2,000  Units total) by mouth 2 (two) times daily. 90 tablet 3  . dapagliflozin propanediol (FARXIGA) 10 MG TABS tablet Take 10 mg by mouth daily. 90 tablet 1  . furosemide (LASIX) 40 MG tablet Take 1 tablet (40 mg total) by mouth 2 (two) times daily. 180 tablet 1  . glucose blood (ONETOUCH VERIO) test strip Use TID 100 each 12  . Icosapent Ethyl (VASCEPA) 1 G CAPS Take 2 capsules by mouth 2 (two) times daily. 120 capsule 11  . insulin detemir (LEVEMIR) 100 UNIT/ML injection Inject 0.3 mLs (30 Units total) into the skin at bedtime. 30 units into skin at bedtime 10 mL 5  . Insulin Pen Needle (PEN NEEDLES 3/16") 31G X 5 MM MISC Use pen needles for injection insulin solution per MD advisement. 50 each 5  . olmesartan (BENICAR) 40 MG tablet Take 1 tablet (40 mg total) by mouth daily. 90 tablet 1  . simvastatin (ZOCOR) 80 MG tablet Take 1 tablet (80 mg total) by mouth at bedtime. 30 tablet 0  . SitaGLIPtin-MetFORMIN HCl (JANUMET XR) 50-1000 MG TB24 Take 1 tablet by mouth 2 (two) times daily. 180 tablet 1  . sulfamethoxazole-trimethoprim (BACTRIM DS,SEPTRA DS) 800-160 MG tablet Take 1 tablet by mouth 2 (two) times daily. 14 tablet 0  . vardenafil (LEVITRA) 20 MG tablet Take 1 tablet (20 mg total) by mouth daily as needed for erectile dysfunction. 30 tablet 5   No current facility-administered medications on file  prior to visit.    Review of Systems  Constitutional: Negative for fever and chills.  Skin: Positive for wound.      Objective:    BP 160/102 mmHg  Pulse 118  Temp(Src) 98.4 F (36.9 C) (Oral)  Resp 18  Ht 6' 4"  (1.93 m)  Wt 276 lb (125.193 kg)  BMI 33.61 kg/m2  SpO2 96% Nursing note and vital signs reviewed.  Physical Exam  Constitutional: He is oriented to person, place, and time. He appears well-developed and well-nourished. No distress.  Cardiovascular: Normal rate, regular rhythm, normal heart sounds and intact distal pulses.   Pulmonary/Chest: Effort normal and breath sounds  normal.  Neurological: He is alert and oriented to person, place, and time.  Skin: Skin is warm and dry.     Psychiatric: He has a normal mood and affect. His behavior is normal. Judgment and thought content normal.       Assessment & Plan:   Problem List Items Addressed This Visit      Other   Cellulitis - Primary    Symptoms of cellulitis improving with current regimen of Keflex and Bactrim. Wound cleansed with soap and water and dressed with bandage. Procedure tolerated without difficulties. Instructions on basic wound care provided. Continue current dosages of Keflex and Bactrim until completed. Follow-up if symptoms worsen or fail to improve.

## 2015-01-22 NOTE — Patient Instructions (Signed)
Thank you for choosing [facility].  Summary/Instructions:  If your symptoms worsen or fail to improve, please contact our office for further instruction, or in case of emergency go directly to the emergency room at the closest medical facility.   Please continue to take your medications as prescribed.  Please keep clean with soap and water changing dressing as needed.

## 2015-01-23 ENCOUNTER — Other Ambulatory Visit: Payer: Self-pay | Admitting: Internal Medicine

## 2015-01-23 DIAGNOSIS — Z794 Long term (current) use of insulin: Principal | ICD-10-CM

## 2015-01-23 DIAGNOSIS — E118 Type 2 diabetes mellitus with unspecified complications: Secondary | ICD-10-CM

## 2015-01-23 MED ORDER — GLIPIZIDE 5 MG PO TABS: 2.5000 mg | ORAL_TABLET | Freq: Two times a day (BID) | ORAL | Status: DC

## 2015-01-23 NOTE — Telephone Encounter (Signed)
Changed to glipizide.

## 2015-02-07 ENCOUNTER — Encounter: Payer: Self-pay | Admitting: Podiatry

## 2015-02-07 ENCOUNTER — Ambulatory Visit (INDEPENDENT_AMBULATORY_CARE_PROVIDER_SITE_OTHER): Payer: Federal, State, Local not specified - PPO | Admitting: Podiatry

## 2015-02-07 VITALS — BP 141/82 | HR 103 | Resp 14

## 2015-02-07 DIAGNOSIS — E113559 Type 2 diabetes mellitus with stable proliferative diabetic retinopathy, unspecified eye: Secondary | ICD-10-CM | POA: Diagnosis not present

## 2015-02-07 DIAGNOSIS — Q828 Other specified congenital malformations of skin: Secondary | ICD-10-CM | POA: Diagnosis not present

## 2015-02-07 DIAGNOSIS — L84 Corns and callosities: Secondary | ICD-10-CM

## 2015-02-07 DIAGNOSIS — B351 Tinea unguium: Secondary | ICD-10-CM | POA: Diagnosis not present

## 2015-02-07 DIAGNOSIS — M79676 Pain in unspecified toe(s): Secondary | ICD-10-CM | POA: Diagnosis not present

## 2015-02-07 DIAGNOSIS — E119 Type 2 diabetes mellitus without complications: Secondary | ICD-10-CM

## 2015-02-07 NOTE — Progress Notes (Deleted)
   Subjective:    Patient ID: Jim Schultz, male    DOB: 01-08-1961, 54 y.o.   MRN: [id number]  HPI  B/L toenail trim, diabetic feet exam, callouses, discoloration and toenail lifting  on right great toe, and B/L feet callouses.  Review of Systems  All other systems reviewed and are negative.      Objective:   Physical Exam        Assessment & Plan:

## 2015-02-07 NOTE — Progress Notes (Signed)
Subjective:     Patient ID: Jim Schultz, male   DOB: 03/27/1960, 54 y.o.   MRN: [id number]  HPI this patient presents to the office for an evaluation of his painful big toe nail on the right foot. He was referred to this office by his medical doctor thought he was developing a pre-ulcerous callus on the tip of his right big toe. This patient gives a history of having the nail fall off approximately 2 years ago and it has regrown nail has no "thick and deformed. The nail is painful as he walks and wears his shoes. He also has painful callus on the tip of the great toe of the right foot. He presents to the clinic for evaluation of this toe and requests a diabetic foot exam   Review of Systems     Objective:   Physical Exam GENERAL APPEARANCE: Alert, conversant. Appropriately groomed. No acute distress.  VASCULAR: Pedal pulses palpable at  [facility] and PT bilateral.  Capillary refill time is immediate to all digits,  Normal temperature gradient.  Digital hair growth is present bilateral  NEUROLOGIC: sensation is normal to 5.07 monofilament at 5/5 sites bilateral.  Light touch is intact bilateral, Muscle strength normal.  MUSCULOSKELETAL: acceptable muscle strength, tone and stability bilateral.  Intrinsic muscluature intact bilateral.  Rectus appearance of foot and digits noted bilateral. Limited ROM rearfoot through his STJ.  DERMATOLOGIC: skin color, texture, and turgor are within normal limits.  No preulcerative lesions or ulcers  are seen, no interdigital maceration noted.  No open lesions present.  Digital nails are asymptomatic. No drainage noted. Diffuse asymptomatic callus forefeet B/L. Porokeratotic lesion lest hallux.  Difuse callus distal aspect right hallux with fissures noted.      Assessment:     Onychomycosis   Pre-ulcerous callus right hallux.     Plan:     IE  Diabetic Foot Exam.  Debridement of onychomycosis B/L   Debride callus distal aspect right hallux.  RTC 10 weeks.     Gardiner Barefoot DPM

## 2015-02-13 ENCOUNTER — Encounter: Payer: Self-pay | Admitting: Internal Medicine

## 2015-04-10 ENCOUNTER — Encounter: Payer: Self-pay | Admitting: Podiatry

## 2015-04-10 ENCOUNTER — Other Ambulatory Visit: Payer: Self-pay | Admitting: Internal Medicine

## 2015-04-10 DIAGNOSIS — E118 Type 2 diabetes mellitus with unspecified complications: Secondary | ICD-10-CM

## 2015-04-10 DIAGNOSIS — I10 Essential (primary) hypertension: Secondary | ICD-10-CM

## 2015-04-10 MED ORDER — SIMVASTATIN 80 MG PO TABS: 80.0000 mg | ORAL_TABLET | Freq: Every day | ORAL | Status: DC

## 2015-04-17 ENCOUNTER — Ambulatory Visit: Payer: Federal, State, Local not specified - PPO | Admitting: Podiatry

## 2015-05-11 ENCOUNTER — Other Ambulatory Visit: Payer: Self-pay | Admitting: Internal Medicine

## 2015-07-21 ENCOUNTER — Other Ambulatory Visit: Payer: Self-pay | Admitting: Internal Medicine

## 2015-07-22 ENCOUNTER — Other Ambulatory Visit: Payer: Self-pay | Admitting: Internal Medicine

## 2015-08-04 ENCOUNTER — Other Ambulatory Visit: Payer: Self-pay | Admitting: Internal Medicine

## 2015-11-26 ENCOUNTER — Other Ambulatory Visit: Payer: Self-pay | Admitting: Internal Medicine

## 2015-12-11 ENCOUNTER — Other Ambulatory Visit: Payer: Self-pay | Admitting: Internal Medicine

## 2016-01-14 ENCOUNTER — Other Ambulatory Visit (INDEPENDENT_AMBULATORY_CARE_PROVIDER_SITE_OTHER): Payer: Federal, State, Local not specified - PPO

## 2016-01-14 ENCOUNTER — Encounter: Payer: Self-pay | Admitting: Nurse Practitioner

## 2016-01-14 ENCOUNTER — Ambulatory Visit (INDEPENDENT_AMBULATORY_CARE_PROVIDER_SITE_OTHER): Payer: Federal, State, Local not specified - PPO | Admitting: Nurse Practitioner

## 2016-01-14 VITALS — BP 148/101 | HR 86 | Temp 98.1°F | Ht 76.0 in | Wt 275.0 lb

## 2016-01-14 DIAGNOSIS — R1011 Right upper quadrant pain: Secondary | ICD-10-CM

## 2016-01-14 DIAGNOSIS — R630 Anorexia: Secondary | ICD-10-CM | POA: Diagnosis not present

## 2016-01-14 DIAGNOSIS — K859 Acute pancreatitis without necrosis or infection, unspecified: Secondary | ICD-10-CM | POA: Diagnosis not present

## 2016-01-14 LAB — COMPREHENSIVE METABOLIC PANEL
ALT: 30 U/L (ref 0–53)
AST: 17 U/L (ref 0–37)
Albumin: 4.2 g/dL (ref 3.5–5.2)
Alkaline Phosphatase: 74 U/L (ref 39–117)
BILIRUBIN TOTAL: 1 mg/dL (ref 0.2–1.2)
BUN: 15 mg/dL (ref 6–23)
CO2: 34 meq/L — AB (ref 19–32)
Calcium: 9.3 mg/dL (ref 8.4–10.5)
Chloride: 98 mEq/L (ref 96–112)
Creatinine, Ser: 0.93 mg/dL (ref 0.40–1.50)
GFR: 89.34 mL/min (ref >60.00)
GLUCOSE: 201 mg/dL — AB (ref 70–99)
Potassium: 3.1 mEq/L — ABNORMAL LOW (ref 3.5–5.1)
SODIUM: 140 meq/L (ref 135–145)
Total Protein: 6.7 g/dL (ref 6.0–8.3)

## 2016-01-14 LAB — CBC WITH DIFFERENTIAL/PLATELET
BASOS PCT: 0.3 % (ref 0.0–3.0)
Basophils Absolute: 0 10*3/uL (ref 0.0–0.1)
EOS ABS: 0.1 10*3/uL (ref 0.0–0.7)
Eosinophils Relative: 1.8 % (ref 0.0–5.0)
HEMATOCRIT: 42.5 % (ref 39.0–52.0)
Hemoglobin: 14.9 g/dL (ref 13.0–17.0)
LYMPHS PCT: 22.7 % (ref 12.0–46.0)
Lymphs Abs: 1.7 10*3/uL (ref 0.7–4.0)
MCHC: 35 g/dL (ref 30.0–36.0)
MCV: 90.1 fl (ref 78.0–100.0)
Monocytes Absolute: 0.7 10*3/uL (ref 0.1–1.0)
Monocytes Relative: 10.2 % (ref 3.0–12.0)
NEUTROS ABS: 4.7 10*3/uL (ref 1.4–7.7)
Neutrophils Relative %: 65 % (ref 43.0–77.0)
PLATELETS: 214 10*3/uL (ref 150.0–400.0)
RBC: 4.72 Mil/uL (ref 4.22–5.81)
RDW: 13 % (ref 11.5–15.5)
WBC: 7.3 10*3/uL (ref 4.0–10.5)

## 2016-01-14 LAB — LIPASE: Lipase: 108 U/L — ABNORMAL HIGH (ref 11.0–59.0)

## 2016-01-14 MED ORDER — TRAMADOL HCL 50 MG PO TABS: 100.0000 mg | ORAL_TABLET | Freq: Four times a day (QID) | ORAL | 0 refills | Status: DC | PRN

## 2016-01-14 NOTE — Progress Notes (Signed)
Pre visit review using our clinic review tool, if applicable. No additional management support is needed unless otherwise documented below in the visit note. 

## 2016-01-14 NOTE — Progress Notes (Signed)
Subjective:  Patient ID: Jim Schultz, male    DOB: [birth year]  Age: 55 y.o. MRN: [id number]  CC: Chest Pain (Pt stated right lower rib is painful when sitting/walking for 1 week.)  Abdominal Pain  This is a new problem. The current episode started in the past 7 days. The onset quality is gradual. The problem occurs constantly. The problem has been gradually worsening. The pain is located in the RUQ. The pain is moderate. The quality of the pain is a sensation of fullness and colicky. The abdominal pain does not radiate. Associated symptoms include anorexia and constipation. Pertinent negatives include no arthralgias, belching, diarrhea, dysuria, fever, flatus, frequency, headaches, hematochezia, hematuria, melena, myalgias, nausea, vomiting or weight loss. The pain is aggravated by certain positions, movement and palpation. The pain is relieved by being still. He has tried nothing for the symptoms. There is no history of abdominal surgery, colon cancer, Crohn's disease, gallstones, GERD, irritable bowel syndrome, pancreatitis or PUD.    Outpatient Medications Prior to Visit  Medication Sig Dispense Refill  . aspirin EC 81 MG tablet Take 1 tablet (81 mg total) by mouth 2 (two) times daily. 90 tablet 3  . Blood Glucose Calibration (ONETOUCH VERIO) SOLN 1 Act by In Vitro route 3 (three) times daily. 1 each 11  . Blood Glucose Monitoring Suppl (ONETOUCH VERIO IQ SYSTEM) W/DEVICE KIT 1 Act by Does not apply route 3 (three) times daily. 1 kit 2  . Cholecalciferol (VITAMIN D) 2000 UNITS tablet Take 1 tablet (2,000 Units total) by mouth 2 (two) times daily. 90 tablet 3  . furosemide (LASIX) 40 MG tablet TAKE 1 TABLET (40 MG TOTAL) BY MOUTH 2 (TWO) TIMES DAILY. 180 tablet 1  . glipiZIDE (GLUCOTROL) 5 MG tablet TAKE 0.5 TABLETS (2.5 MG TOTAL) BY MOUTH 2 (TWO) TIMES DAILY BEFORE A MEAL. 90 tablet 1  . glucose blood (ONETOUCH VERIO) test strip Use TID 100 each 12  . Icosapent Ethyl (VASCEPA) 1 G CAPS Take  2 capsules by mouth 2 (two) times daily. 120 capsule 11  . insulin detemir (LEVEMIR) 100 UNIT/ML injection Inject 0.3 mLs (30 Units total) into the skin at bedtime. 30 units into skin at bedtime 10 mL 5  . Insulin Pen Needle (PEN NEEDLES 3/16") 31G X 5 MM MISC Use pen needles for injection insulin solution per MD advisement. 50 each 5  . LEVEMIR FLEXTOUCH 100 UNIT/ML Pen INJECT 30 UNITS SUBCUTANEOUSLY DAILY AT 10 PM 15 pen 0  . olmesartan (BENICAR) 40 MG tablet Take 1 tablet (40 mg total) by mouth daily. 90 tablet 1  . simvastatin (ZOCOR) 80 MG tablet Take 1 tablet (80 mg total) by mouth at bedtime. 90 tablet 3  . vardenafil (LEVITRA) 20 MG tablet Take 1 tablet (20 mg total) by mouth daily as needed for erectile dysfunction. 30 tablet 5  . cephALEXin (KEFLEX) 500 MG capsule Take 1 capsule (500 mg total) by mouth 4 (four) times daily. 40 capsule 0  . furosemide (LASIX) 40 MG tablet TAKE 1 TABLET (40 MG TOTAL) BY MOUTH 2 (TWO) TIMES DAILY. 180 tablet 1  . glipiZIDE (GLUCOTROL) 5 MG tablet TAKE 0.5 TABLETS (2.5 MG TOTAL) BY MOUTH 2 (TWO) TIMES DAILY BEFORE A MEAL. 90 tablet 1  . SitaGLIPtin-MetFORMIN HCl (JANUMET XR) 50-1000 MG TB24 Take 1 tablet by mouth 2 (two) times daily. 180 tablet 1  . sulfamethoxazole-trimethoprim (BACTRIM DS,SEPTRA DS) 800-160 MG tablet Take 1 tablet by mouth 2 (two) times daily. 14 tablet 0  No facility-administered medications prior to visit.     ROS See HPI  Objective:  BP (!) 148/101 (BP Location: Left Arm, Patient Position: Sitting, Cuff Size: Large)   Pulse 86   Temp 98.1 F (36.7 C)   Ht 6' 4"  (1.93 m)   Wt 275 lb (124.7 kg)   SpO2 96%   BMI 33.47 kg/m   BP Readings from Last 3 Encounters:  01/14/16 (!) 148/101  02/07/15 (!) 141/82  01/22/15 (!) 160/102    Wt Readings from Last 3 Encounters:  01/14/16 275 lb (124.7 kg)  01/22/15 276 lb (125.2 kg)  01/20/15 284 lb (128.8 kg)    Physical Exam  Constitutional: He is oriented to person, place, and  time.  Cardiovascular: Normal rate.   Pulmonary/Chest: Effort normal.  Abdominal: Soft. Bowel sounds are normal. There is tenderness. There is guarding.  RUQ tenderness  Neurological: He is alert and oriented to person, place, and time.  Skin: Skin is warm and dry. No rash noted. No erythema.  Vitals reviewed.   Lab Results  Component Value Date   WBC 7.3 01/14/2016   HGB 14.9 01/14/2016   HCT 42.5 01/14/2016   PLT 214.0 01/14/2016   GLUCOSE 201 (H) 01/14/2016   CHOL 219 (H) 01/16/2015   TRIG (H) 01/16/2015    735.0 Triglyceride is over 400; calculations on Lipids are invalid.   HDL 31.30 (L) 01/16/2015   LDLDIRECT 70.0 01/16/2015   LDLCALC 44 07/24/2013   ALT 30 01/14/2016   AST 17 01/14/2016   NA 140 01/14/2016   K 3.1 (L) 01/14/2016   CL 98 01/14/2016   CREATININE 0.93 01/14/2016   BUN 15 01/14/2016   CO2 34 (H) 01/14/2016   TSH 1.23 01/16/2015   PSA 0.57 01/16/2015   HGBA1C 8.9 (H) 01/16/2015   MICROALBUR 2.6 (H) 01/16/2015    Dg Tibia/fibula Left  Result Date: 01/20/2015 CLINICAL DATA:  Left leg pain and swelling. Cellulitis and blister/bleb. Evaluate for necrotizing fasciitis. EXAM: LEFT TIBIA AND FIBULA - 2 VIEW COMPARISON:  None. FINDINGS: There is diffuse subcutaneous edema of the left lower extremity. No tracking soft tissue air to suggest necrotizing fasciitis. Linear 1.6 cm density just anterior to the mid tibial cortex, seen only on the lateral views. Multifocal vascular calcifications. No acute osseous abnormality, no fracture, dislocation, osseous erosion or periosteal reaction. IMPRESSION: 1. Diffuse subcutaneous edema. No tracking soft tissue air to suggest necrotizing fasciitis. 2. Linear density just anterior to the mid tibial cortex, nonspecific. This may reflect foreign body versus soft tissue calcification. Electronically Signed   By: Jeb Levering M.D.   On: 01/20/2015 22:07    Assessment & Plan:   Shalev was seen today for chest  pain.  Diagnoses and all orders for this visit:  Acute pancreatitis, unspecified complication status, unspecified pancreatitis type -     traMADol (ULTRAM) 50 MG tablet; Take 2 tablets (100 mg total) by mouth every 6 (six) hours as needed.  Continuous RUQ abdominal pain -     US Abdomen Limited RUQ; Future -     Comp Met (CMET); Future -     Lipase; Future -     CBC w/Diff; Future  Anorexia -     US Abdomen Limited RUQ; Future -     Comp Met (CMET); Future -     Lipase; Future -     CBC w/Diff; Future   I have discontinued Jim Schultz SitaGLIPtin-MetFORMIN HCl, cephALEXin, and sulfamethoxazole-trimethoprim. I am also having  him start on traMADol. Additionally, I am having him maintain his vardenafil, ONETOUCH VERIO, ONETOUCH VERIO IQ SYSTEM, Vitamin D, insulin detemir, olmesartan, aspirin EC, Pen Needles 3/16", glucose blood, Icosapent Ethyl, simvastatin, glipiZIDE, furosemide, and LEVEMIR FLEXTOUCH.  Meds ordered this encounter  Medications  . traMADol (ULTRAM) 50 MG tablet    Sig: Take 2 tablets (100 mg total) by mouth every 6 (six) hours as needed.    Dispense:  30 tablet    Refill:  0    Order Specific Question:   Supervising Provider    Answer:   Cassandria Anger [[id number]]   CBC    Component Value Date/Time   WBC 7.3 01/14/2016 1619   RBC 4.72 01/14/2016 1619   HGB 14.9 01/14/2016 1619   HCT 42.5 01/14/2016 1619   PLT 214.0 01/14/2016 1619   MCV 90.1 01/14/2016 1619   MCHC 35.0 01/14/2016 1619   RDW 13.0 01/14/2016 1619   LYMPHSABS 1.7 01/14/2016 1619   MONOABS 0.7 01/14/2016 1619   EOSABS 0.1 01/14/2016 1619   BASOSABS 0.0 01/14/2016 1619   CMP     Component Value Date/Time   NA 140 01/14/2016 1619   K 3.1 (L) 01/14/2016 1619   CL 98 01/14/2016 1619   CO2 34 (H) 01/14/2016 1619   GLUCOSE 201 (H) 01/14/2016 1619   BUN 15 01/14/2016 1619   CREATININE 0.93 01/14/2016 1619   CALCIUM 9.3 01/14/2016 1619   PROT 6.7 01/14/2016 1619   ALBUMIN 4.2 01/14/2016  1619   AST 17 01/14/2016 1619   ALT 30 01/14/2016 1619   ALKPHOS 74 01/14/2016 1619   BILITOT 1.0 01/14/2016 1619   Lipase     Component Value Date/Time   LIPASE 108.0 (H) 01/14/2016 1619    Follow-up: Return if symptoms worsen or fail to improve, for DM and HTN.  Wilfred Lacy, NP

## 2016-01-14 NOTE — Patient Instructions (Addendum)
Will be called with lab results.  You have appt tomorrow with [facility] for RUQ Abdominal ultrasound at 12:40pm ([place]) No eating or drinking after midnight or at least 6hrs prior to ultrasound.  Go to hospital if symptoms worsen.  Hold metformin. Maintain clear liquid diet.  Maintain appt with Dr. Ronnald Ramp tomorrow.

## 2016-01-15 ENCOUNTER — Ambulatory Visit
Admission: RE | Admit: 2016-01-15 | Discharge: 2016-01-15 | Disposition: A | Discharge status: Home / Self Care | Payer: Federal, State, Local not specified - PPO | Source: Ambulatory Visit | Attending: Nurse Practitioner | Admitting: Nurse Practitioner

## 2016-01-15 ENCOUNTER — Encounter: Payer: Self-pay | Admitting: Internal Medicine

## 2016-01-15 ENCOUNTER — Ambulatory Visit (INDEPENDENT_AMBULATORY_CARE_PROVIDER_SITE_OTHER): Payer: Federal, State, Local not specified - PPO | Admitting: Internal Medicine

## 2016-01-15 ENCOUNTER — Other Ambulatory Visit (INDEPENDENT_AMBULATORY_CARE_PROVIDER_SITE_OTHER): Payer: Federal, State, Local not specified - PPO

## 2016-01-15 ENCOUNTER — Ambulatory Visit (INDEPENDENT_AMBULATORY_CARE_PROVIDER_SITE_OTHER)
Admission: RE | Admit: 2016-01-15 | Discharge: 2016-01-15 | Disposition: A | Discharge status: Home / Self Care | Payer: Federal, State, Local not specified - PPO | Source: Ambulatory Visit | Attending: Internal Medicine | Admitting: Internal Medicine

## 2016-01-15 VITALS — BP 154/98 | HR 83 | Temp 98.0°F | Resp 16 | Ht 76.0 in | Wt 278.2 lb

## 2016-01-15 DIAGNOSIS — R0789 Other chest pain: Secondary | ICD-10-CM

## 2016-01-15 DIAGNOSIS — E781 Pure hyperglyceridemia: Secondary | ICD-10-CM

## 2016-01-15 DIAGNOSIS — E876 Hypokalemia: Secondary | ICD-10-CM | POA: Insufficient documentation

## 2016-01-15 DIAGNOSIS — R7989 Other specified abnormal findings of blood chemistry: Secondary | ICD-10-CM

## 2016-01-15 DIAGNOSIS — E118 Type 2 diabetes mellitus with unspecified complications: Secondary | ICD-10-CM | POA: Diagnosis not present

## 2016-01-15 DIAGNOSIS — E785 Hyperlipidemia, unspecified: Secondary | ICD-10-CM

## 2016-01-15 DIAGNOSIS — R1011 Right upper quadrant pain: Secondary | ICD-10-CM

## 2016-01-15 DIAGNOSIS — I10 Essential (primary) hypertension: Secondary | ICD-10-CM

## 2016-01-15 DIAGNOSIS — R630 Anorexia: Secondary | ICD-10-CM

## 2016-01-15 LAB — LIPID PANEL
Cholesterol: 131 mg/dL (ref 0–200)
HDL: 32.6 mg/dL — ABNORMAL LOW (ref >39.00)
NonHDL: 98.3
Total CHOL/HDL Ratio: 4
Triglycerides: 261 mg/dL — ABNORMAL HIGH (ref 0.0–149.0)
VLDL: 52.2 mg/dL — ABNORMAL HIGH (ref 0.0–40.0)

## 2016-01-15 LAB — URINALYSIS, ROUTINE W REFLEX MICROSCOPIC
BILIRUBIN URINE: NEGATIVE
Hgb urine dipstick: NEGATIVE
Ketones, ur: NEGATIVE
LEUKOCYTES UA: NEGATIVE
NITRITE: NEGATIVE
PH: 6 (ref 5.0–8.0)
SPECIFIC GRAVITY, URINE: 1.02 (ref 1.000–1.030)
Total Protein, Urine: NEGATIVE
Urine Glucose: 500 — AB
Urobilinogen, UA: 0.2 (ref 0.0–1.0)

## 2016-01-15 LAB — BASIC METABOLIC PANEL
BUN: 15 mg/dL (ref 6–23)
CO2: 36 meq/L — AB (ref 19–32)
Calcium: 9.3 mg/dL (ref 8.4–10.5)
Chloride: 98 mEq/L (ref 96–112)
Creatinine, Ser: 0.85 mg/dL (ref 0.40–1.50)
GFR: 99.11 mL/min (ref >60.00)
GLUCOSE: 245 mg/dL — AB (ref 70–99)
POTASSIUM: 3.5 meq/L (ref 3.5–5.1)
SODIUM: 139 meq/L (ref 135–145)

## 2016-01-15 LAB — LDL CHOLESTEROL, DIRECT: Direct LDL: 55 mg/dL

## 2016-01-15 LAB — MICROALBUMIN / CREATININE URINE RATIO
Creatinine,U: 184.6 mg/dL
MICROALB UR: 2.7 mg/dL — AB (ref 0.0–1.9)
MICROALB/CREAT RATIO: 1.5 mg/g (ref 0.0–30.0)

## 2016-01-15 LAB — MAGNESIUM: MAGNESIUM: 1.8 mg/dL (ref 1.5–2.5)

## 2016-01-15 LAB — HEMOGLOBIN A1C: HEMOGLOBIN A1C: 10.1 % — AB (ref 4.6–6.5)

## 2016-01-15 LAB — TSH: TSH: 1.43 u[IU]/mL (ref 0.35–4.50)

## 2016-01-15 MED ORDER — TELMISARTAN 80 MG PO TABS: 80.0000 mg | ORAL_TABLET | Freq: Every day | ORAL | 3 refills | Status: AC

## 2016-01-15 MED ORDER — POTASSIUM CHLORIDE ER 10 MEQ PO TBCR: 10.0000 meq | EXTENDED_RELEASE_TABLET | Freq: Three times a day (TID) | ORAL | 3 refills | Status: AC

## 2016-01-15 MED ORDER — ICOSAPENT ETHYL 1 G PO CAPS: 2.0000 | ORAL_CAPSULE | Freq: Two times a day (BID) | ORAL | 11 refills | Status: AC

## 2016-01-15 NOTE — Patient Instructions (Signed)
Hypertension Hypertension, commonly called high blood pressure, is when the force of blood pumping through your arteries is too strong. Your arteries are the blood vessels that carry blood from your heart throughout your body. A blood pressure reading consists of a higher number over a lower number, such as 110/72. The higher number (systolic) is the pressure inside your arteries when your heart pumps. The lower number (diastolic) is the pressure inside your arteries when your heart relaxes. Ideally you want your blood pressure below 120/80. Hypertension forces your heart to work harder to pump blood. Your arteries may become narrow or stiff. Having untreated or uncontrolled hypertension can cause heart attack, stroke, kidney disease, and other problems. RISK FACTORS Some risk factors for high blood pressure are controllable. Others are not.  Risk factors you cannot control include:   Race. You may be at higher risk if you are African American.  Age. Risk increases with age.  Gender. Men are at higher risk than women before age 45 years. After age 65, women are at higher risk than men. Risk factors you can control include:  Not getting enough exercise or physical activity.  Being overweight.  Getting too much fat, sugar, calories, or salt in your diet.  Drinking too much alcohol. SIGNS AND SYMPTOMS Hypertension does not usually cause signs or symptoms. Extremely high blood pressure (hypertensive crisis) may cause headache, anxiety, shortness of breath, and nosebleed. DIAGNOSIS To check if you have hypertension, your health care provider will measure your blood pressure while you are seated, with your arm held at the level of your heart. It should be measured at least twice using the same arm. Certain conditions can cause a difference in blood pressure between your right and left arms. A blood pressure reading that is higher than normal on one occasion does not mean that you need treatment. If  it is not clear whether you have high blood pressure, you may be asked to return on a different day to have your blood pressure checked again. Or, you may be asked to monitor your blood pressure at home for 1 or more weeks. TREATMENT Treating high blood pressure includes making lifestyle changes and possibly taking medicine. Living a healthy lifestyle can help lower high blood pressure. You may need to change some of your habits. Lifestyle changes may include:  Following the DASH diet. This diet is high in fruits, vegetables, and whole grains. It is low in salt, red meat, and added sugars.  Keep your sodium intake below 2,300 mg per day.  Getting at least 30-45 minutes of aerobic exercise at least 4 times per week.  Losing weight if necessary.  Not smoking.  Limiting alcoholic beverages.  Learning ways to reduce stress. Your health care provider may prescribe medicine if lifestyle changes are not enough to get your blood pressure under control, and if one of the following is true:  You are 18-59 years of age and your systolic blood pressure is above 140.  You are 60 years of age or older, and your systolic blood pressure is above 150.  Your diastolic blood pressure is above 90.  You have diabetes, and your systolic blood pressure is over 140 or your diastolic blood pressure is over 90.  You have kidney disease and your blood pressure is above 140/90.  You have heart disease and your blood pressure is above 140/90. Your personal target blood pressure may vary depending on your medical conditions, your age, and other factors. HOME CARE INSTRUCTIONS    Have your blood pressure rechecked as directed by your health care provider.   Take medicines only as directed by your health care provider. Follow the directions carefully. Blood pressure medicines must be taken as prescribed. The medicine does not work as well when you skip doses. Skipping doses also puts you at risk for  problems.  Do not smoke.   Monitor your blood pressure at home as directed by your health care provider. SEEK MEDICAL CARE IF:   You think you are having a reaction to medicines taken.  You have recurrent headaches or feel dizzy.  You have swelling in your ankles.  You have trouble with your vision. SEEK IMMEDIATE MEDICAL CARE IF:  You develop a severe headache or confusion.  You have unusual weakness, numbness, or feel faint.  You have severe chest or abdominal pain.  You vomit repeatedly.  You have trouble breathing. MAKE SURE YOU:   Understand these instructions.  Will watch your condition.  Will get help right away if you are not doing well or get worse.   This information is not intended to replace advice given to you by your health care provider. Make sure you discuss any questions you have with your health care provider.   Document Released: 02/23/2005 Document Revised: 07/10/2014 Document Reviewed: 12/16/2012 Elsevier Interactive Patient Education 2016 [employer]  

## 2016-01-15 NOTE — Progress Notes (Signed)
Pre visit review using our clinic review tool, if applicable. No additional management support is needed unless otherwise documented below in the visit note. 

## 2016-01-15 NOTE — Progress Notes (Signed)
Subjective:  Patient ID: Jim Schultz, male    DOB: [birth year]  Age: 55 y.o. MRN: [id number]  CC: Hypertension; Hyperlipidemia; and Diabetes   HPI Quy Lotts presents for follow-up on the above medical problems.  He complains of a 10 day history of pain over his right upper quadrant and right lateral rib cage. He was seen one day prior to this by another provider and labs were relatively unremarkable with the exception of a low potassium level. He is scheduled for an abdominal ultrasound earlier today. He complains of a sharp, intermittent pain over his right rib cage that is worsened by bending, movement, and palpation. He denies cough, shortness of breath, nausea, vomiting, dysuria, hematuria, diarrhea, or constipation.  He tells me his blood pressure is not been well controlled. Somehow he stopped taking the ARB about 3 or 4 months ago. He is not sure why.  He doesn't know if his blood sugars are well controlled because he has not been monitoring them  recently.  Outpatient Medications Prior to Visit  Medication Sig Dispense Refill  . aspirin EC 81 MG tablet Take 1 tablet (81 mg total) by mouth 2 (two) times daily. 90 tablet 3  . Blood Glucose Calibration (ONETOUCH VERIO) SOLN 1 Act by In Vitro route 3 (three) times daily. 1 each 11  . Blood Glucose Monitoring Suppl (ONETOUCH VERIO IQ SYSTEM) W/DEVICE KIT 1 Act by Does not apply route 3 (three) times daily. 1 kit 2  . Cholecalciferol (VITAMIN D) 2000 UNITS tablet Take 1 tablet (2,000 Units total) by mouth 2 (two) times daily. 90 tablet 3  . furosemide (LASIX) 40 MG tablet TAKE 1 TABLET (40 MG TOTAL) BY MOUTH 2 (TWO) TIMES DAILY. 180 tablet 1  . glucose blood (ONETOUCH VERIO) test strip Use TID 100 each 12  . insulin detemir (LEVEMIR) 100 UNIT/ML injection Inject 0.3 mLs (30 Units total) into the skin at bedtime. 30 units into skin at bedtime 10 mL 5  . Insulin Pen Needle (PEN NEEDLES 3/16") 31G X 5 MM MISC Use pen needles for  injection insulin solution per MD advisement. 50 each 5  . LEVEMIR FLEXTOUCH 100 UNIT/ML Pen INJECT 30 UNITS SUBCUTANEOUSLY DAILY AT 10 PM 15 pen 0  . simvastatin (ZOCOR) 80 MG tablet Take 1 tablet (80 mg total) by mouth at bedtime. 90 tablet 3  . glipiZIDE (GLUCOTROL) 5 MG tablet TAKE 0.5 TABLETS (2.5 MG TOTAL) BY MOUTH 2 (TWO) TIMES DAILY BEFORE A MEAL. 90 tablet 1  . Icosapent Ethyl (VASCEPA) 1 G CAPS Take 2 capsules by mouth 2 (two) times daily. 120 capsule 11  . vardenafil (LEVITRA) 20 MG tablet Take 1 tablet (20 mg total) by mouth daily as needed for erectile dysfunction. (Patient not taking: Reported on 01/15/2016) 30 tablet 5  . olmesartan (BENICAR) 40 MG tablet Take 1 tablet (40 mg total) by mouth daily. (Patient not taking: Reported on 01/15/2016) 90 tablet 1  . traMADol (ULTRAM) 50 MG tablet Take 2 tablets (100 mg total) by mouth every 6 (six) hours as needed. (Patient not taking: Reported on 01/15/2016) 30 tablet 0   No facility-administered medications prior to visit.     ROS Review of Systems  Constitutional: Negative for appetite change, chills, diaphoresis, fatigue and fever.  HENT: Negative.  Negative for trouble swallowing.   Eyes: Negative.   Respiratory: Negative for cough, choking, chest tightness, shortness of breath and stridor.   Cardiovascular: Positive for chest pain and leg swelling. Negative for palpitations.  Gastrointestinal: Negative.  Negative for abdominal pain, constipation, diarrhea and nausea.  Endocrine: Negative.  Negative for cold intolerance, heat intolerance, polydipsia, polyphagia and polyuria.  Genitourinary: Negative.  Negative for decreased urine volume, difficulty urinating, dysuria, flank pain and hematuria.  Musculoskeletal: Negative.  Negative for arthralgias, back pain, joint swelling, myalgias and neck pain.  Skin: Negative.  Negative for color change and rash.  Allergic/Immunologic: Negative.   Neurological: Negative.  Negative for dizziness.    Hematological: Negative.  Negative for adenopathy. Does not bruise/bleed easily.  Psychiatric/Behavioral: Negative.     Objective:  BP (!) 154/98 (BP Location: Left Arm, Patient Position: Sitting, Cuff Size: Large)   Pulse 83   Temp 98 F (36.7 C) (Oral)   Resp 16   Ht 6' 4"  (1.93 m)   Wt 278 lb 4 oz (126.2 kg)   SpO2 96%   BMI 33.87 kg/m   BP Readings from Last 3 Encounters:  01/15/16 (!) 154/98  01/14/16 (!) 148/101  02/07/15 (!) 141/82    Wt Readings from Last 3 Encounters:  01/15/16 278 lb 4 oz (126.2 kg)  01/14/16 275 lb (124.7 kg)  01/22/15 276 lb (125.2 kg)    Physical Exam  Constitutional: He is oriented to person, place, and time.  Non-toxic appearance. He does not have a sickly appearance. He does not appear ill. No distress.  HENT:  Mouth/Throat: Oropharynx is clear and moist. No oropharyngeal exudate.  Eyes: Conjunctivae are normal. Right eye exhibits no discharge. Left eye exhibits no discharge. No scleral icterus.  Neck: Normal range of motion. Neck supple. No JVD present. No tracheal deviation present. No thyromegaly present.  Cardiovascular: Normal rate, normal heart sounds and intact distal pulses.  Exam reveals no gallop and no friction rub.   No murmur heard. Pulmonary/Chest: Effort normal and breath sounds normal. No stridor. No respiratory distress. He has no decreased breath sounds. He has no wheezes. He has no rhonchi. He has no rales. He exhibits tenderness and bony tenderness. He exhibits no mass, no crepitus, no edema, no deformity, no swelling and no retraction.    Abdominal: Soft. Bowel sounds are normal. He exhibits no distension and no mass. There is no tenderness. There is no rebound and no guarding.  Musculoskeletal: Normal range of motion. He exhibits edema (2+ pitting edema in BLE). He exhibits no tenderness or deformity.  Lymphadenopathy:    He has no cervical adenopathy.  Neurological: He is oriented to person, place, and time.  Skin:  Skin is warm and dry. No rash noted. He is not diaphoretic. No erythema. No pallor.  Nursing note and vitals reviewed.   Lab Results  Component Value Date   WBC 7.3 01/14/2016   HGB 14.9 01/14/2016   HCT 42.5 01/14/2016   PLT 214.0 01/14/2016   GLUCOSE 245 (H) 01/15/2016   CHOL 131 01/15/2016   TRIG 261.0 (H) 01/15/2016   HDL 32.60 (L) 01/15/2016   LDLDIRECT 55.0 01/15/2016   LDLCALC 44 07/24/2013   ALT 30 01/14/2016   AST 17 01/14/2016   NA 139 01/15/2016   K 3.5 01/15/2016   CL 98 01/15/2016   CREATININE 0.85 01/15/2016   BUN 15 01/15/2016   CO2 36 (H) 01/15/2016   TSH 1.43 01/15/2016   PSA 0.57 01/16/2015   HGBA1C 10.1 (H) 01/15/2016   MICROALBUR 2.7 (H) 01/15/2016    Dg Tibia/fibula Left  Result Date: 01/20/2015 CLINICAL DATA:  Left leg pain and swelling. Cellulitis and blister/bleb. Evaluate for necrotizing fasciitis. EXAM:  LEFT TIBIA AND FIBULA - 2 VIEW COMPARISON:  None. FINDINGS: There is diffuse subcutaneous edema of the left lower extremity. No tracking soft tissue air to suggest necrotizing fasciitis. Linear 1.6 cm density just anterior to the mid tibial cortex, seen only on the lateral views. Multifocal vascular calcifications. No acute osseous abnormality, no fracture, dislocation, osseous erosion or periosteal reaction. IMPRESSION: 1. Diffuse subcutaneous edema. No tracking soft tissue air to suggest necrotizing fasciitis. 2. Linear density just anterior to the mid tibial cortex, nonspecific. This may reflect foreign body versus soft tissue calcification. Electronically Signed   By: Jeb Levering M.D.   On: 01/20/2015 22:07    Assessment & Plan:   Livio was seen today for hypertension, hyperlipidemia and diabetes.  Diagnoses and all orders for this visit:  Essential hypertension, benign- His blood pressure is not well controlled, will restart an ARB and will treat the hypokalemia -     Basic metabolic panel; Future -     Urinalysis, Routine w reflex  microscopic (not at [facility]); Future -     telmisartan (MICARDIS) 80 MG tablet; Take 1 tablet (80 mg total) by mouth daily. -     potassium chloride (K-DUR) 10 MEQ tablet; Take 1 tablet (10 mEq total) by mouth 3 (three) times daily.  Type 2 diabetes mellitus with complication, without long-term current use of insulin ([place])- his A1c is up to 10.1%, I've asked him to stop taking the sulfonylurea due to weight gain, will continue the basal insulin therapy and will add on a DP before inhibitor and metformin. -     Basic metabolic panel; Future -     Hemoglobin A1c; Future -     Microalbumin / creatinine urine ratio; Future -     telmisartan (MICARDIS) 80 MG tablet; Take 1 tablet (80 mg total) by mouth daily. -     Ambulatory referral to Ophthalmology  Hypertriglyceridemia- improvement noted, he will continue to take VASCEPA, he will also work on his lifestyle modifications. -     Lipid panel; Future -     Icosapent Ethyl (VASCEPA) 1 g CAPS; Take 2 capsules by mouth 2 (two) times daily.  Hyperlipidemia LDL goal <100- he has achieved his LDL goal is doing well on the statin -     Lipid panel; Future -     TSH; Future  Hypokalemia- this is caused by the loop diuretic, will treat this with oral potassium supplementation -     Basic metabolic panel; Future -     Magnesium; Future -     potassium chloride (K-DUR) 10 MEQ tablet; Take 1 tablet (10 mEq total) by mouth 3 (three) times daily.  Acute chest wall pain- plain films are normal, examination is consistent with musculoskeletal chest wall pain, he will treat with over-the-counter doses of ibuprofen and Tylenol. -     DG Ribs Unilateral W/Chest Right; Future   I have discontinued Mr. Gomes olmesartan, glipiZIDE, and traMADol. I am also having him start on telmisartan, potassium chloride, and SitaGLIPtin-MetFORMIN HCl. Additionally, I am having him maintain his vardenafil, ONETOUCH VERIO, ONETOUCH VERIO IQ SYSTEM, Vitamin D, insulin detemir,  aspirin EC, Pen Needles 3/16", glucose blood, simvastatin, furosemide, LEVEMIR FLEXTOUCH, and Icosapent Ethyl.  Meds ordered this encounter  Medications  . telmisartan (MICARDIS) 80 MG tablet    Sig: Take 1 tablet (80 mg total) by mouth daily.    Dispense:  90 tablet    Refill:  3  . Icosapent Ethyl (VASCEPA) 1 g  CAPS    Sig: Take 2 capsules by mouth 2 (two) times daily.    Dispense:  120 capsule    Refill:  11  . potassium chloride (K-DUR) 10 MEQ tablet    Sig: Take 1 tablet (10 mEq total) by mouth 3 (three) times daily.    Dispense:  90 tablet    Refill:  3  . SitaGLIPtin-MetFORMIN HCl 50-1000 MG TB24    Sig: Take 2 tablets by mouth daily.    Dispense:  60 tablet    Refill:  5     Follow-up: Return in about 3 weeks (around 02/05/2016).  Scarlette Calico, MD

## 2016-01-16 ENCOUNTER — Other Ambulatory Visit: Payer: Self-pay | Admitting: Nurse Practitioner

## 2016-01-16 DIAGNOSIS — K859 Acute pancreatitis without necrosis or infection, unspecified: Secondary | ICD-10-CM

## 2016-01-16 DIAGNOSIS — R1011 Right upper quadrant pain: Secondary | ICD-10-CM

## 2016-01-16 MED ORDER — SITAGLIP PHOS-METFORMIN HCL ER 50-1000 MG PO TB24: 2.0000 | ORAL_TABLET | Freq: Every day | ORAL | 5 refills | Status: AC

## 2016-01-17 ENCOUNTER — Other Ambulatory Visit (INDEPENDENT_AMBULATORY_CARE_PROVIDER_SITE_OTHER): Payer: Federal, State, Local not specified - PPO

## 2016-01-17 DIAGNOSIS — K859 Acute pancreatitis without necrosis or infection, unspecified: Secondary | ICD-10-CM | POA: Diagnosis not present

## 2016-01-17 DIAGNOSIS — R1011 Right upper quadrant pain: Secondary | ICD-10-CM | POA: Diagnosis not present

## 2016-01-17 LAB — AMYLASE: Amylase: 31 U/L (ref 27–131)

## 2016-01-17 LAB — LIPASE: LIPASE: 77 U/L — AB (ref 11.0–59.0)

## 2016-01-25 ENCOUNTER — Other Ambulatory Visit: Payer: Self-pay | Admitting: Internal Medicine

## 2016-01-25 MED ORDER — FUROSEMIDE 40 MG PO TABS: ORAL_TABLET | ORAL | 1 refills | Status: AC

## 2016-02-05 ENCOUNTER — Ambulatory Visit: Payer: Federal, State, Local not specified - PPO | Admitting: Internal Medicine

## 2016-02-12 ENCOUNTER — Ambulatory Visit (INDEPENDENT_AMBULATORY_CARE_PROVIDER_SITE_OTHER): Payer: Federal, State, Local not specified - PPO | Admitting: Internal Medicine

## 2016-02-12 ENCOUNTER — Encounter: Payer: Self-pay | Admitting: Internal Medicine

## 2016-02-12 VITALS — BP 134/84 | HR 96 | Temp 98.5°F | Ht 76.0 in | Wt 283.8 lb

## 2016-02-12 DIAGNOSIS — B351 Tinea unguium: Secondary | ICD-10-CM | POA: Diagnosis not present

## 2016-02-12 DIAGNOSIS — I10 Essential (primary) hypertension: Secondary | ICD-10-CM | POA: Diagnosis not present

## 2016-02-12 DIAGNOSIS — Z794 Long term (current) use of insulin: Secondary | ICD-10-CM

## 2016-02-12 DIAGNOSIS — L84 Corns and callosities: Secondary | ICD-10-CM | POA: Diagnosis not present

## 2016-02-12 DIAGNOSIS — E118 Type 2 diabetes mellitus with unspecified complications: Secondary | ICD-10-CM | POA: Diagnosis not present

## 2016-02-12 NOTE — Progress Notes (Signed)
Pre visit review using our clinic review tool, if applicable. No additional management support is needed unless otherwise documented below in the visit note. 

## 2016-02-12 NOTE — Patient Instructions (Signed)

## 2016-02-12 NOTE — Progress Notes (Signed)
Subjective:  Patient ID: Jim Schultz, male    DOB: Apr 08, 1960  Age: 55 y.o. MRN: [id number]  CC: Hypertension and Diabetes   HPI Jim Schultz presents for f/up on HTN and DM2. He feels well and offers no complaints. He knows that his A1c is very high but he is not motivated to get any improvement in his blood sugars. He has gained 5 pounds since I last saw him. Prior episode of abdominal pain has resolved. He does have some calluses with ulcers over the tip of 2 right toes and wants to see a podiatrist.  Outpatient Medications Prior to Visit  Medication Sig Dispense Refill  . aspirin EC 81 MG tablet Take 1 tablet (81 mg total) by mouth 2 (two) times daily. 90 tablet 3  . Blood Glucose Calibration (ONETOUCH VERIO) SOLN 1 Act by In Vitro route 3 (three) times daily. 1 each 11  . Blood Glucose Monitoring Suppl (ONETOUCH VERIO IQ SYSTEM) W/DEVICE KIT 1 Act by Does not apply route 3 (three) times daily. 1 kit 2  . Cholecalciferol (VITAMIN D) 2000 UNITS tablet Take 1 tablet (2,000 Units total) by mouth 2 (two) times daily. 90 tablet 3  . furosemide (LASIX) 40 MG tablet TAKE 1 TABLET (40 MG TOTAL) BY MOUTH 2 (TWO) TIMES DAILY. 180 tablet 1  . glucose blood (ONETOUCH VERIO) test strip Use TID 100 each 12  . Icosapent Ethyl (VASCEPA) 1 g CAPS Take 2 capsules by mouth 2 (two) times daily. 120 capsule 11  . insulin detemir (LEVEMIR) 100 UNIT/ML injection Inject 0.3 mLs (30 Units total) into the skin at bedtime. 30 units into skin at bedtime 10 mL 5  . Insulin Pen Needle (PEN NEEDLES 3/16") 31G X 5 MM MISC Use pen needles for injection insulin solution per MD advisement. 50 each 5  . LEVEMIR FLEXTOUCH 100 UNIT/ML Pen INJECT 30 UNITS SUBCUTANEOUSLY DAILY AT 10 PM 15 pen 0  . potassium chloride (K-DUR) 10 MEQ tablet Take 1 tablet (10 mEq total) by mouth 3 (three) times daily. 90 tablet 3  . simvastatin (ZOCOR) 80 MG tablet Take 1 tablet (80 mg total) by mouth at bedtime. 90 tablet 3  .  SitaGLIPtin-MetFORMIN HCl 50-1000 MG TB24 Take 2 tablets by mouth daily. 60 tablet 5  . telmisartan (MICARDIS) 80 MG tablet Take 1 tablet (80 mg total) by mouth daily. 90 tablet 3  . vardenafil (LEVITRA) 20 MG tablet Take 1 tablet (20 mg total) by mouth daily as needed for erectile dysfunction. (Patient not taking: Reported on 01/15/2016) 30 tablet 5   No facility-administered medications prior to visit.     ROS Review of Systems  Constitutional: Negative for appetite change, chills, fatigue and unexpected weight change.  HENT: Negative.   Eyes: Negative.   Respiratory: Negative for cough, chest tightness, shortness of breath, wheezing and stridor.   Cardiovascular: Negative for chest pain, palpitations and leg swelling.  Gastrointestinal: Negative for abdominal pain, constipation, diarrhea, nausea and vomiting.  Endocrine: Negative for polydipsia, polyphagia and polyuria.  Genitourinary: Negative.  Negative for difficulty urinating.  Musculoskeletal: Negative.  Negative for arthralgias, back pain, myalgias and neck pain.  Skin: Negative.   Neurological: Negative.  Negative for dizziness, weakness and light-headedness.  Hematological: Negative.  Negative for adenopathy. Does not bruise/bleed easily.  Psychiatric/Behavioral: Negative.     Objective:  BP 134/84 (BP Location: Left Arm, Patient Position: Sitting, Cuff Size: Large)   Pulse 96   Temp 98.5 F (36.9 C) (Oral)  Ht 6' 4"  (1.93 m)   Wt 283 lb 12 oz (128.7 kg)   SpO2 96%   BMI 34.54 kg/m   BP Readings from Last 3 Encounters:  02/12/16 134/84  01/15/16 (!) 154/98  01/14/16 (!) 148/101    Wt Readings from Last 3 Encounters:  02/12/16 283 lb 12 oz (128.7 kg)  01/15/16 278 lb 4 oz (126.2 kg)  01/14/16 275 lb (124.7 kg)    Physical Exam  Constitutional: He is oriented to person, place, and time. No distress.  HENT:  Mouth/Throat: No oropharyngeal exudate.  Eyes: Conjunctivae and EOM are normal. Right eye exhibits no  discharge. Left eye exhibits no discharge. No scleral icterus.  Neck: Normal range of motion. Neck supple. No JVD present. No tracheal deviation present. No thyromegaly present.  Cardiovascular: Normal rate, regular rhythm, normal heart sounds and intact distal pulses.  Exam reveals no gallop and no friction rub.   No murmur heard. Pulmonary/Chest: Effort normal and breath sounds normal. No stridor. No respiratory distress. He has no wheezes. He has no rales. He exhibits no tenderness.  Abdominal: Soft. Bowel sounds are normal. He exhibits no distension and no mass. There is no tenderness. There is no rebound and no guarding.  Musculoskeletal: Normal range of motion. He exhibits edema (trace edema over BLE). He exhibits no tenderness or deformity.  Lymphadenopathy:    He has no cervical adenopathy.  Neurological: He is oriented to person, place, and time.  Skin: Skin is warm and dry. No rash noted. He is not diaphoretic. No erythema. No pallor.  Vitals reviewed.   Lab Results  Component Value Date   WBC 7.3 01/14/2016   HGB 14.9 01/14/2016   HCT 42.5 01/14/2016   PLT 214.0 01/14/2016   GLUCOSE 245 (H) 01/15/2016   CHOL 131 01/15/2016   TRIG 261.0 (H) 01/15/2016   HDL 32.60 (L) 01/15/2016   LDLDIRECT 55.0 01/15/2016   LDLCALC 44 07/24/2013   ALT 30 01/14/2016   AST 17 01/14/2016   NA 139 01/15/2016   K 3.5 01/15/2016   CL 98 01/15/2016   CREATININE 0.85 01/15/2016   BUN 15 01/15/2016   CO2 36 (H) 01/15/2016   TSH 1.43 01/15/2016   PSA 0.57 01/16/2015   HGBA1C 10.1 (H) 01/15/2016   MICROALBUR 2.7 (H) 01/15/2016    Dg Ribs Unilateral W/chest Right  Result Date: 01/15/2016 CLINICAL DATA:  Right lower chest wall pain EXAM: RIGHT RIBS AND CHEST - 3+ VIEW COMPARISON:  None. FINDINGS: Normal heart size. Lungs clear. No pneumothorax. No pleural effusion. No acute rib fracture. IMPRESSION: No evidence of acute rib fracture. Electronically Signed   By: Marybelle Killings M.D.   On:  01/15/2016 15:42   US Abdomen Limited Ruq  Result Date: 01/15/2016 CLINICAL DATA:  Right upper quadrant pain for 10 days. No known injury. EXAM: US ABDOMEN LIMITED - RIGHT UPPER QUADRANT COMPARISON:  None. FINDINGS: Gallbladder: No gallstones or wall thickening visualized. No sonographic Murphy sign noted by sonographer. Common bile duct: Diameter: 0.6 cm Liver: No focal lesion. The liver demonstrates diffusely increased echogenicity and coarsened echotexture. IMPRESSION: No acute abnormality.  Negative for gallstones. Fatty liver. Electronically Signed   By: Inge Rise M.D.   On: 01/15/2016 13:50    Assessment & Plan:   Lavon was seen today for hypertension and diabetes.  Diagnoses and all orders for this visit:  Pre-ulcerative corn or callous -     Ambulatory referral to Podiatry  Onychomycosis of foot with other  complication -     Ambulatory referral to Podiatry  Type 2 diabetes mellitus with complication, with long-term current use of insulin ([facility])- blood sugars are not well controlled but he tells me that this is the best that he can do, will cont the current meds and recheck his A1C in about 3 months -     Ambulatory referral to Ophthalmology -     Ambulatory referral to Podiatry  Essential hypertension, benign- his BP is well controlled   I am having Mr. Devan maintain his vardenafil, Roma Schanz, ONETOUCH VERIO IQ SYSTEM, Vitamin D, insulin detemir, aspirin EC, Pen Needles 3/16", glucose blood, simvastatin, LEVEMIR FLEXTOUCH, telmisartan, Icosapent Ethyl, potassium chloride, SitaGLIPtin-MetFORMIN HCl, and furosemide.  No orders of the defined types were placed in this encounter.    Follow-up: Return in about 3 months (around 05/12/2016).  Scarlette Calico, MD

## 2016-03-07 ENCOUNTER — Telehealth: Payer: Self-pay

## 2016-03-07 NOTE — Telephone Encounter (Signed)
[employer] sent fax stating that pt may be noncompliant with glipizide 5 mg. Last pick up was on 10/31/2015 for a 90 day supply. Information was confirmed with pharmacy.   LVM for pt to call back as soon as possible.   RE: Is he still taking the glipizide daily?

## 2016-03-10 NOTE — Telephone Encounter (Signed)
Patient  Called back.  States he is taking glipizide.

## 2016-03-11 NOTE — Telephone Encounter (Signed)
noted 

## 2016-03-13 ENCOUNTER — Telehealth: Payer: Self-pay | Admitting: *Deleted

## 2016-03-13 NOTE — Telephone Encounter (Addendum)
Pt states he has several toes that he feels are infected and he needs to be seen earlier than the 03/18/2016 appt he has. I spoke with pt and he states he is in the ER, but thanked me for calling.

## 2016-03-18 ENCOUNTER — Ambulatory Visit: Payer: Federal, State, Local not specified - PPO | Admitting: Podiatry

## 2016-04-16 ENCOUNTER — Other Ambulatory Visit: Payer: Self-pay | Admitting: Internal Medicine

## 2016-04-23 ENCOUNTER — Other Ambulatory Visit: Payer: Self-pay | Admitting: Internal Medicine

## 2016-04-23 DIAGNOSIS — I10 Essential (primary) hypertension: Secondary | ICD-10-CM

## 2016-04-23 DIAGNOSIS — E118 Type 2 diabetes mellitus with unspecified complications: Secondary | ICD-10-CM

## 2016-07-21 ENCOUNTER — Other Ambulatory Visit: Payer: Self-pay | Admitting: Internal Medicine

## 2016-07-21 DIAGNOSIS — Z794 Long term (current) use of insulin: Principal | ICD-10-CM

## 2016-07-21 DIAGNOSIS — E118 Type 2 diabetes mellitus with unspecified complications: Secondary | ICD-10-CM

## 2017-07-01 ENCOUNTER — Other Ambulatory Visit: Payer: Self-pay | Admitting: *Deleted

## 2017-07-11 ENCOUNTER — Encounter: Payer: Self-pay | Admitting: Internal Medicine

## 2017-09-10 IMAGING — DX DG RIBS W/ CHEST 3+V*R*
3 series · 3 of 3 positions shown · non-contrast
Comparison: None.

CLINICAL DATA: Right lower chest wall pain

EXAM:
RIGHT RIBS AND CHEST - 3+ VIEW

[chest pa]
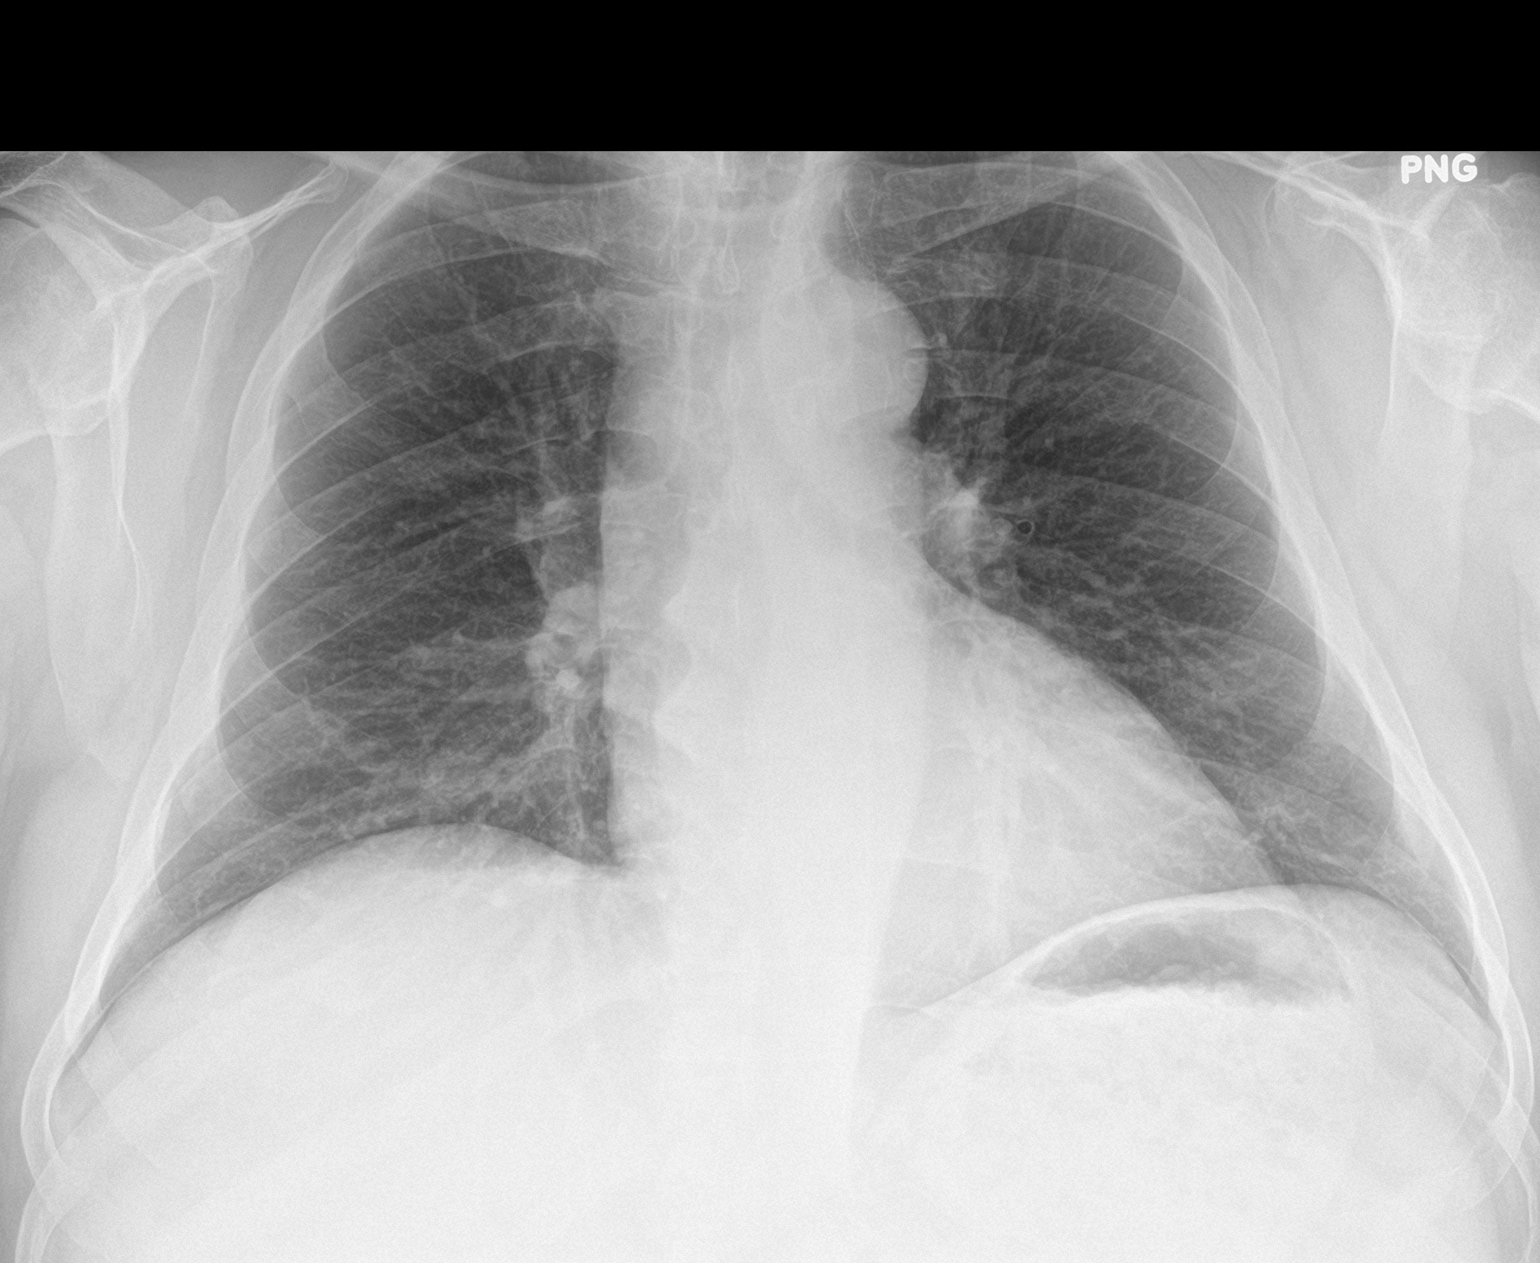

[rib pa]
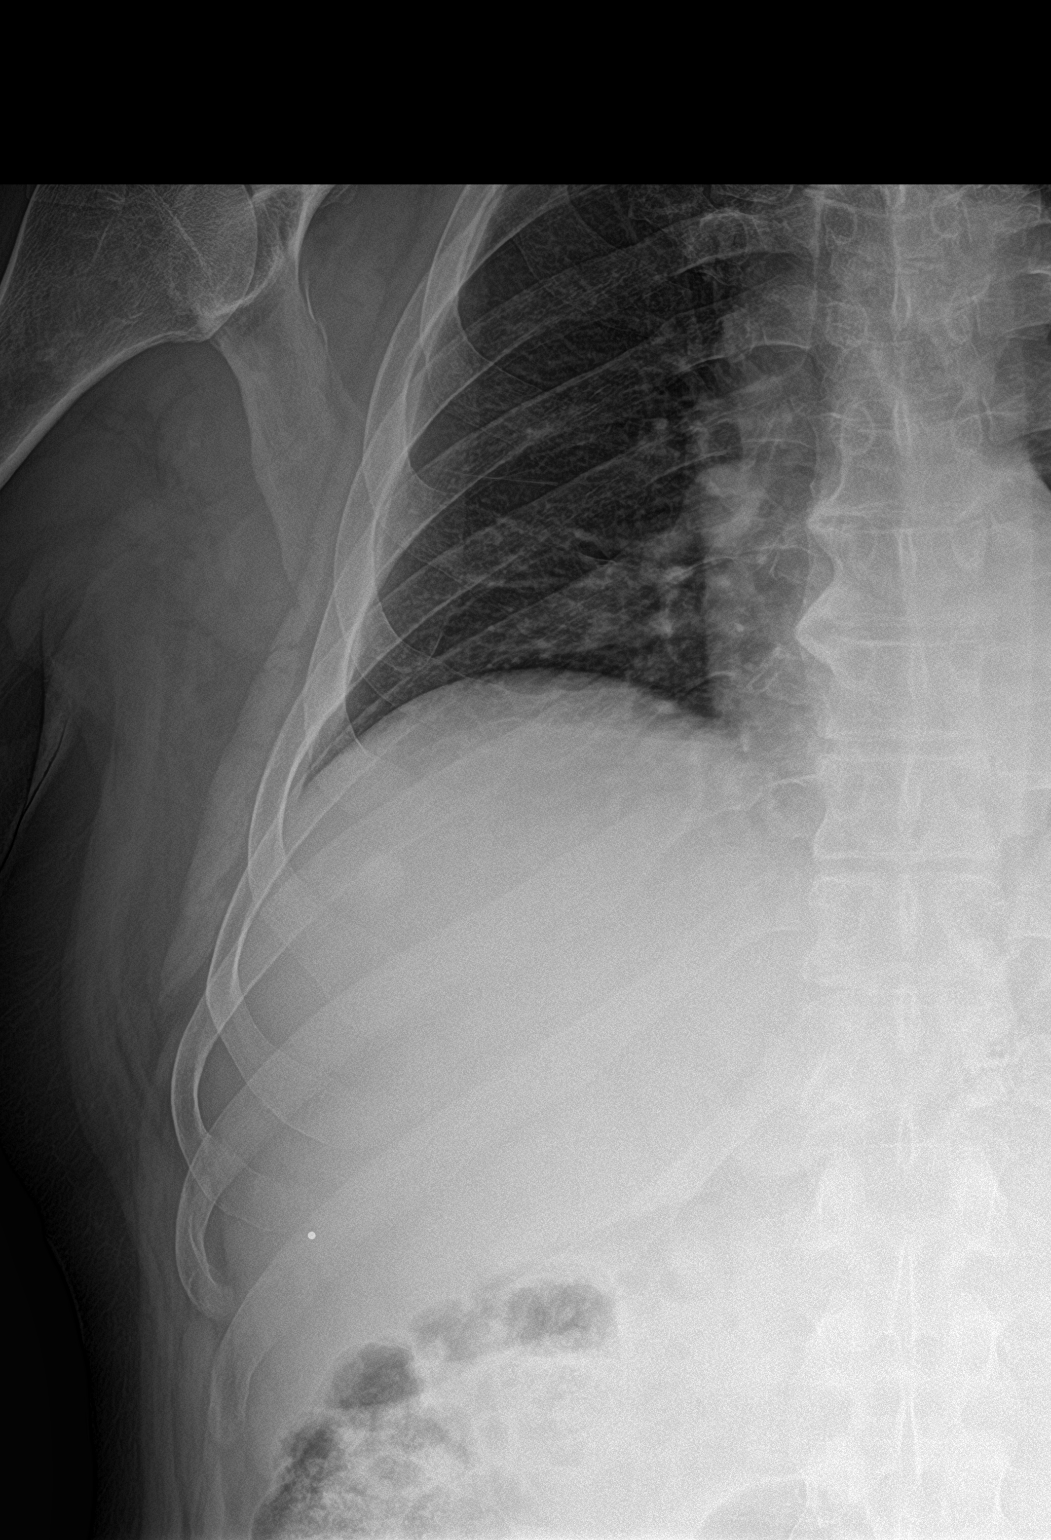

[rib obl]
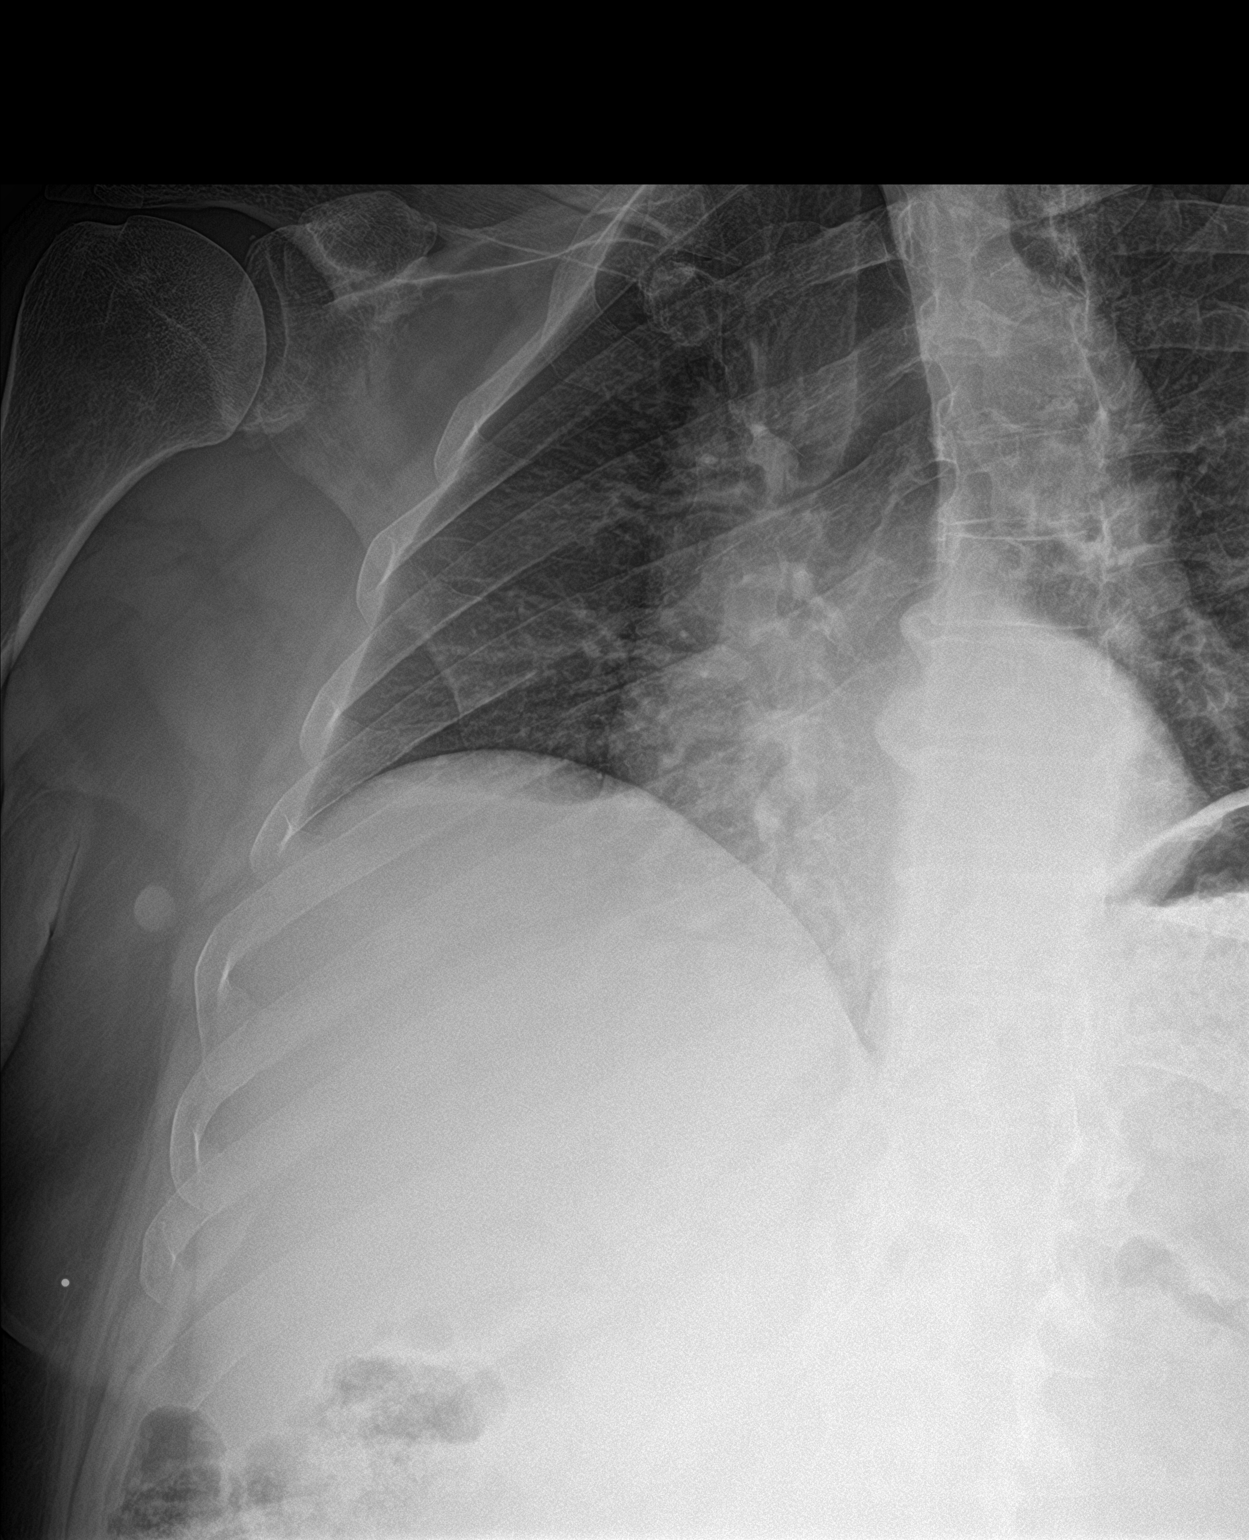

[3 of 3 positions shown; findings below may reference images not displayed]

FINDINGS: Normal heart size. Lungs clear. No pneumothorax. No pleural
effusion. No acute rib fracture.
IMPRESSION: No evidence of acute rib fracture.

## 2017-09-10 IMAGING — US US ABDOMEN LIMITED
1 series · 14 of 25 positions shown · non-contrast
Comparison: None.

CLINICAL DATA: Right upper quadrant pain for 10 days. No known
injury.

EXAM:
US ABDOMEN LIMITED - RIGHT UPPER QUADRANT

[Series 1: us abdomen limited · 0.32mm/px · 14 of 49 slices shown]
[im 1/49]
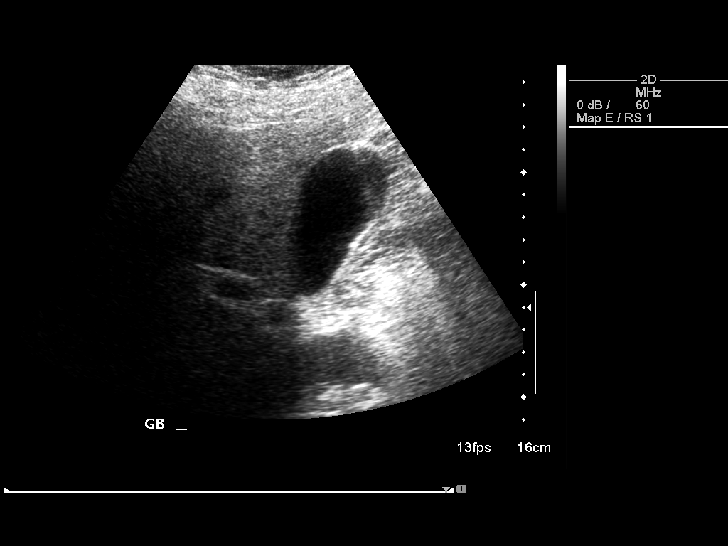
[im 5/49]
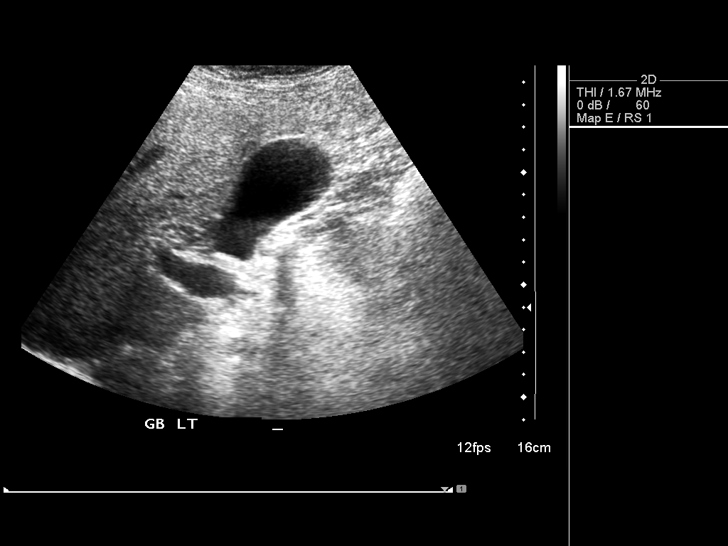
[im 9/49]
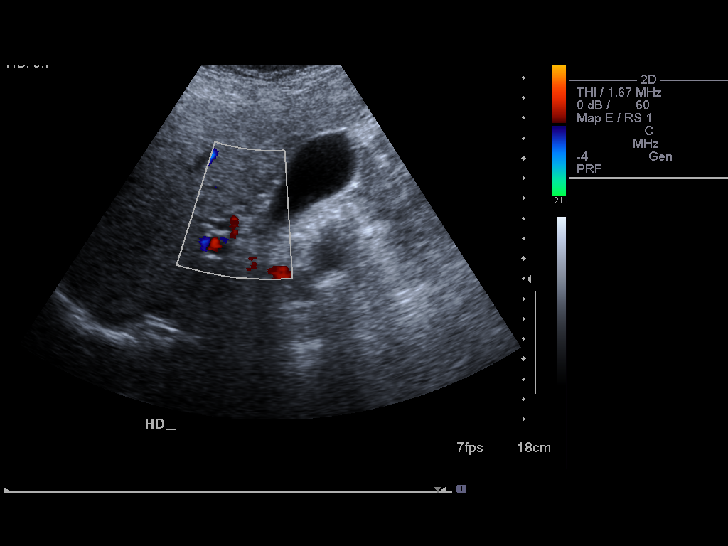
[im 13/49]
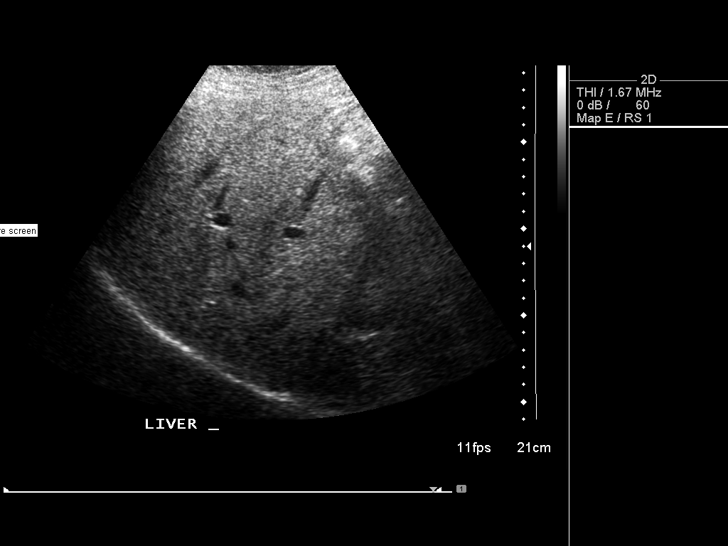
[im 17/49]
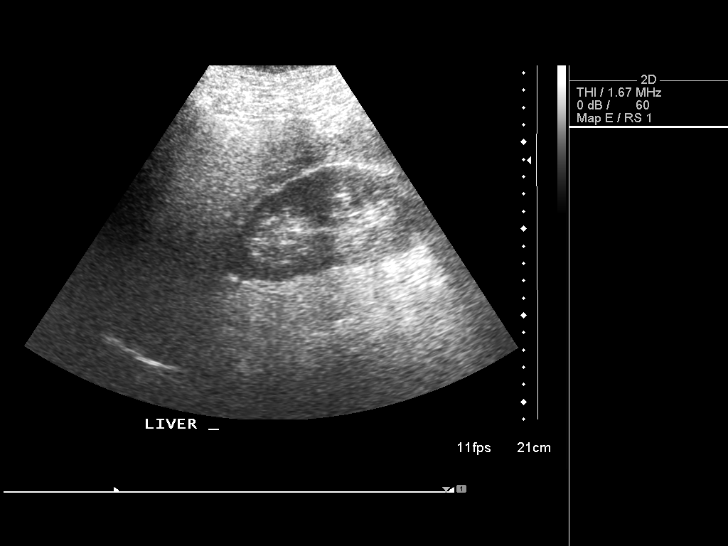
[im 19/49]
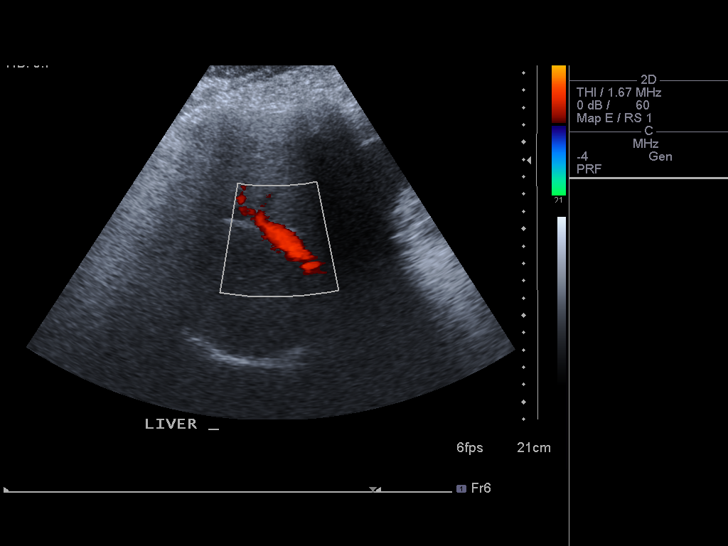
[im 23/49]
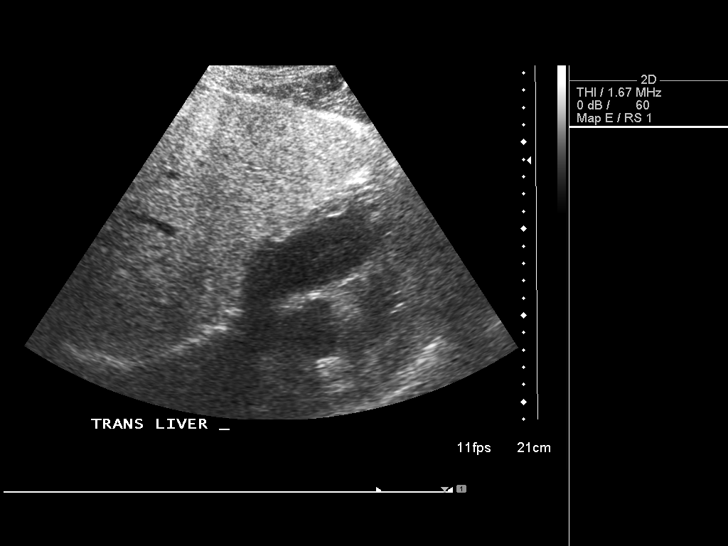
[im 27/49]
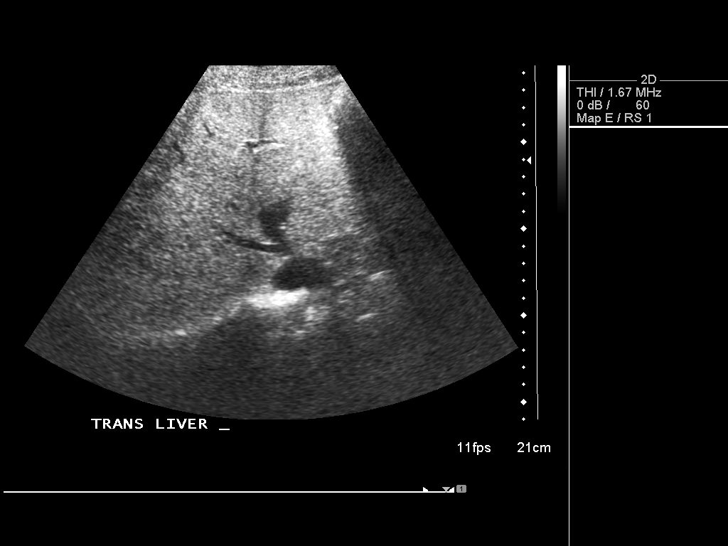
[im 31/49]
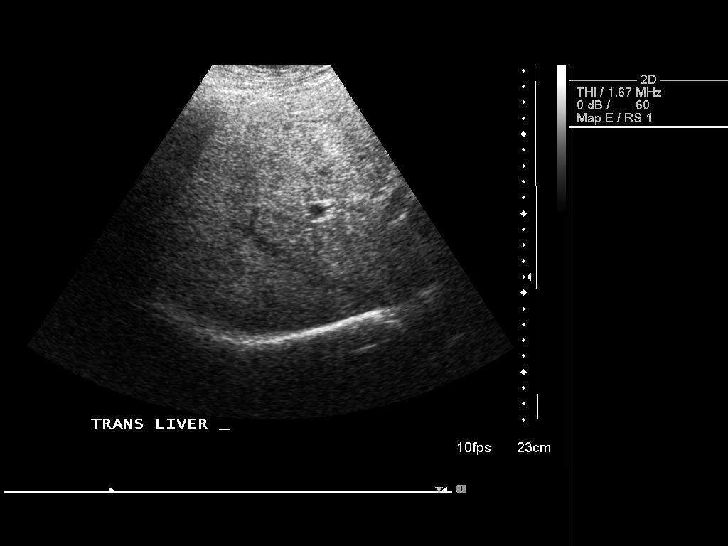
[im 33/49]
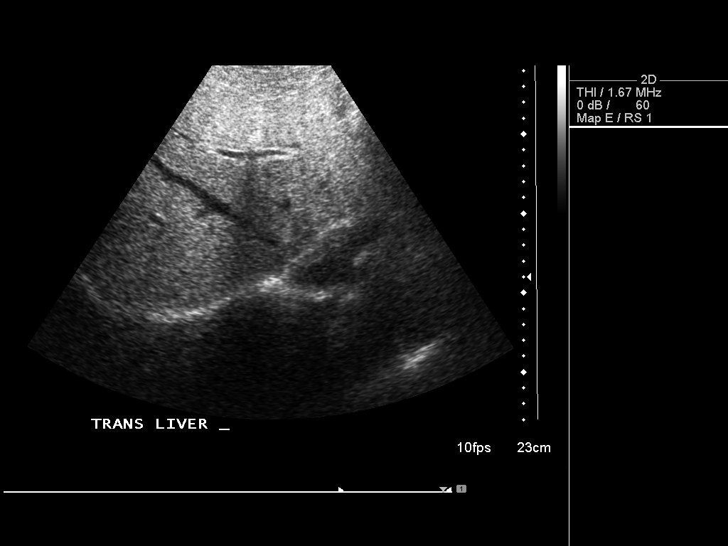
[im 37/49]
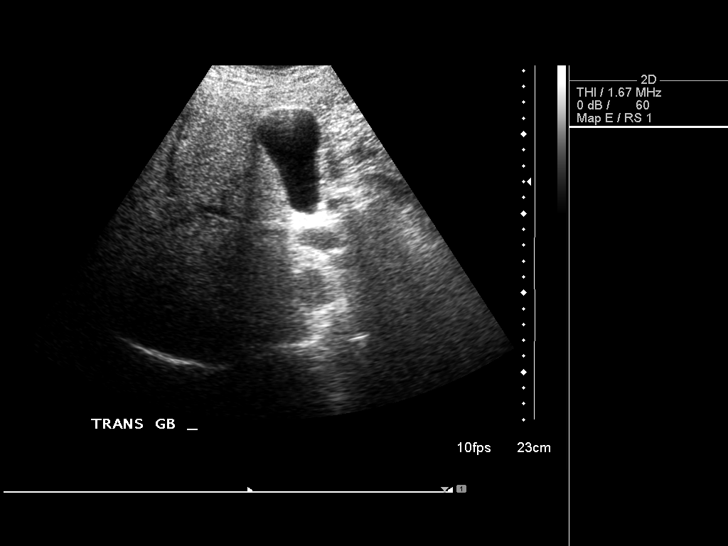
[im 41/49]
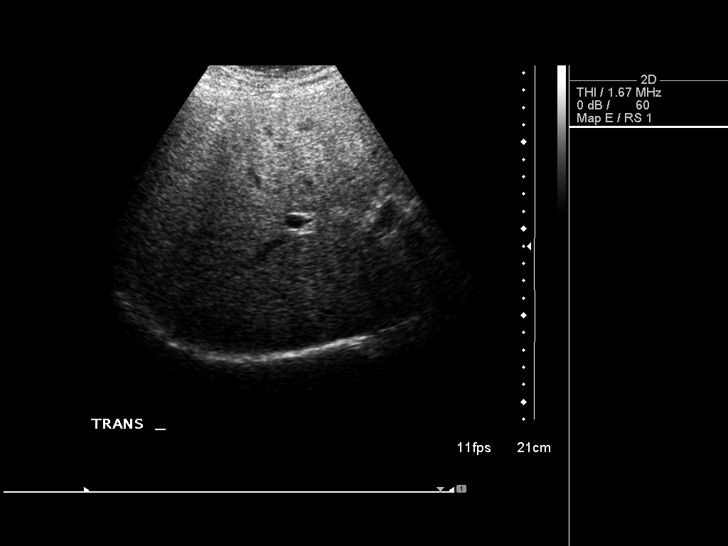
[im 45/49]
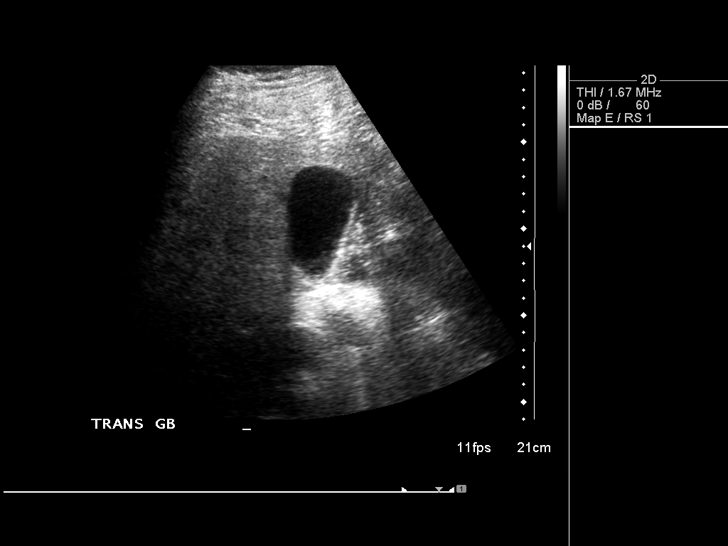
[im 49/49]
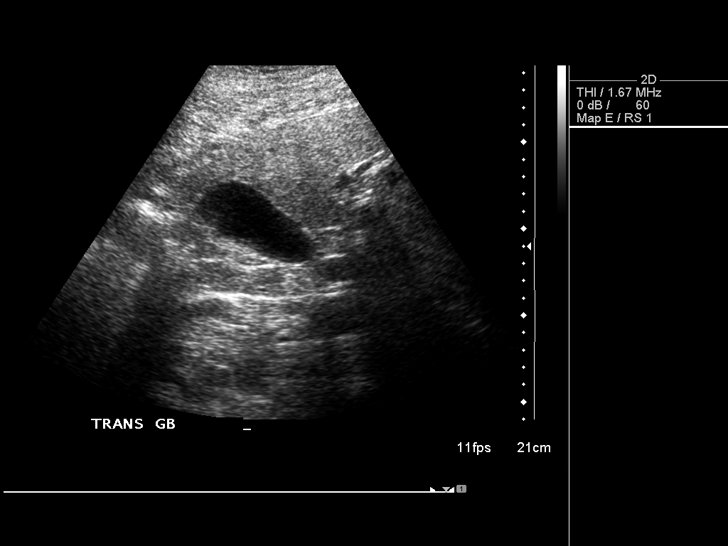

[14 of 25 positions shown; findings below may reference images not displayed]

FINDINGS: Gallbladder:

No gallstones or wall thickening visualized. No sonographic Murphy
sign noted by sonographer.

Common bile duct:

Diameter: 0.6 cm

Liver:

No focal lesion. The liver demonstrates diffusely increased
echogenicity and coarsened echotexture.
IMPRESSION: No acute abnormality.  Negative for gallstones.

Fatty liver.
